# Patient Record
Sex: Female | Born: 1975
Health system: Southern US, Community
[De-identification: ages and names within clinical notes are randomized; demographics above are authoritative.]

## PROBLEM LIST (undated history)

## (undated) DIAGNOSIS — E119 Type 2 diabetes mellitus without complications: Secondary | ICD-10-CM

## (undated) HISTORY — DX: Type 2 diabetes mellitus without complications: E11.9

---

## 2005-11-29 ENCOUNTER — Ambulatory Visit: Payer: Self-pay | Admitting: Family Medicine

## 2006-05-09 ENCOUNTER — Emergency Department (HOSPITAL_COMMUNITY): Admission: EM | Admit: 2006-05-09 | Discharge: 2006-05-09 | Payer: Self-pay | Admitting: Emergency Medicine

## 2008-11-19 ENCOUNTER — Encounter (INDEPENDENT_AMBULATORY_CARE_PROVIDER_SITE_OTHER): Payer: Self-pay | Admitting: Unknown Physician Specialty

## 2008-11-19 ENCOUNTER — Other Ambulatory Visit: Admission: RE | Admit: 2008-11-19 | Discharge: 2008-11-19 | Payer: Self-pay | Admitting: Unknown Physician Specialty

## 2009-06-17 ENCOUNTER — Other Ambulatory Visit: Admission: RE | Admit: 2009-06-17 | Discharge: 2009-06-17 | Payer: Self-pay | Admitting: Unknown Physician Specialty

## 2009-10-14 ENCOUNTER — Other Ambulatory Visit: Admission: RE | Admit: 2009-10-14 | Discharge: 2009-10-14 | Payer: Self-pay | Admitting: Unknown Physician Specialty

## 2014-12-07 ENCOUNTER — Emergency Department (HOSPITAL_COMMUNITY): Payer: Self-pay

## 2014-12-07 ENCOUNTER — Emergency Department (HOSPITAL_COMMUNITY)
Admission: EM | Admit: 2014-12-07 | Discharge: 2014-12-07 | Disposition: A | Discharge status: Home / Self Care | Payer: Self-pay | Attending: Emergency Medicine | Admitting: Emergency Medicine

## 2014-12-07 ENCOUNTER — Encounter (HOSPITAL_COMMUNITY): Payer: Self-pay | Admitting: *Deleted

## 2014-12-07 DIAGNOSIS — Y9289 Other specified places as the place of occurrence of the external cause: Secondary | ICD-10-CM | POA: Insufficient documentation

## 2014-12-07 DIAGNOSIS — S92355A Nondisplaced fracture of fifth metatarsal bone, left foot, initial encounter for closed fracture: Secondary | ICD-10-CM | POA: Insufficient documentation

## 2014-12-07 DIAGNOSIS — Y9389 Activity, other specified: Secondary | ICD-10-CM | POA: Insufficient documentation

## 2014-12-07 DIAGNOSIS — Z791 Long term (current) use of non-steroidal anti-inflammatories (NSAID): Secondary | ICD-10-CM | POA: Insufficient documentation

## 2014-12-07 DIAGNOSIS — W010XXA Fall on same level from slipping, tripping and stumbling without subsequent striking against object, initial encounter: Secondary | ICD-10-CM | POA: Insufficient documentation

## 2014-12-07 DIAGNOSIS — Z720 Tobacco use: Secondary | ICD-10-CM | POA: Insufficient documentation

## 2014-12-07 DIAGNOSIS — Y998 Other external cause status: Secondary | ICD-10-CM | POA: Insufficient documentation

## 2014-12-07 MED ORDER — HYDROCODONE-ACETAMINOPHEN 5-325 MG PO TABS
1.0000 | ORAL_TABLET | Freq: Once | ORAL | Status: AC
  Administered 2014-12-07: 1 via ORAL
  Filled 2014-12-07: qty 1

## 2014-12-07 MED ORDER — HYDROCODONE-ACETAMINOPHEN 5-325 MG PO TABS: 1.0000 | ORAL_TABLET | ORAL | Status: DC | PRN

## 2014-12-07 MED ORDER — NAPROXEN 500 MG PO TABS: 500.0000 mg | ORAL_TABLET | Freq: Two times a day (BID) | ORAL | Status: DC

## 2014-12-07 NOTE — ED Notes (Signed)
Ice pack given to patient.

## 2014-12-07 NOTE — ED Provider Notes (Signed)
CSN: [id number]     Arrival date & time 12/07/14  0000 History   First MD Initiated Contact with Patient 12/07/14 0043     Chief Complaint  Patient presents with  . Foot Pain     (Consider location/radiation/quality/duration/timing/severity/associated sxs/prior Treatment) Patient is a 39 y.o. female presenting with lower extremity pain. The history is provided by the patient.  Foot Pain This is a new problem. The current episode started today. The problem occurs constantly. The problem has been unchanged. The symptoms are aggravated by walking and standing. She has tried nothing for the symptoms.   Casey Pyleamela Norman is a 39 y.o. female who presents to the ED with left foot pain. She states that her daughter tripped her and she landed on her left foot. She denies any other injuries. She has pain, swelling and bruising to the lateral aspect of the foot that radiates across the foot. The pain is severe.   History reviewed. No pertinent past medical history. History reviewed. No pertinent past surgical history. History reviewed. No pertinent family history. History  Substance Use Topics  . Smoking status: Current Every Day Smoker -- 1.00 packs/day  . Smokeless tobacco: Not on file  . Alcohol Use: Yes     Comment: 4-5 beers /day   OB History    No data available     Review of Systems Negative except as stated in HPI   Allergies  Review of patient's allergies indicates no known allergies.  Home Medications   Prior to Admission medications   Medication Sig Start Date End Date Taking? Authorizing Provider  HYDROcodone-acetaminophen (NORCO/VICODIN) 5-325 MG per tablet Take 1 tablet by mouth every 4 (four) hours as needed. 12/07/14   Fordyce Lepak Orlene OchM Trevis Eden, NP  naproxen (NAPROSYN) 500 MG tablet Take 1 tablet (500 mg total) by mouth 2 (two) times daily. 12/07/14   Adalid Beckmann Orlene OchM Jasmon Mattice, NP   BP 134/86 mmHg  Pulse 96  Temp(Src) 98 F (36.7 C) (Oral)  Resp 20  Ht 5\' 7"  (1.702 m)  Wt 245 lb (111.131 kg)   BMI 38.36 kg/m2  SpO2 99%  LMP 12/04/2014 Physical Exam  Constitutional: She is oriented to person, place, and time. She appears well-developed and well-nourished.  HENT:  Head: Normocephalic and atraumatic.  Eyes: EOM are normal.  Neck: Neck supple.  Cardiovascular: Normal rate.   Pulmonary/Chest: Effort normal.  Musculoskeletal: Normal range of motion.       Left foot: There is tenderness, bony tenderness and swelling. There is normal range of motion, normal capillary refill, no deformity and no laceration.       Feet:  Left lateral foot with tenderness on palpation, ecchymosis noted at base of 5th toe. Pedal pulse 2+, adequate circulation, good touch sensation, good strength.   Neurological: She is alert and oriented to person, place, and time. No cranial nerve deficit.  Skin: Skin is warm and dry.  Psychiatric: She has a normal mood and affect. Her behavior is normal.  Nursing note and vitals reviewed.   ED Course  Procedures  Dg Foot Complete Left  12/07/2014   CLINICAL DATA:  Injured foot tonight.  EXAM: LEFT FOOT - COMPLETE 3+ VIEW  COMPARISON:  None.  FINDINGS: There is an oblique and mildly displaced fracture involving the distal shaft of the fifth metatarsal extending to the metatarsal neck. The joint spaces are maintained. No other fractures. Mild pes cavus deformity. Calcaneal spurs are noted.  IMPRESSION: Mildly displaced oblique fracture involving the distal shaft of the  fifth metatarsal.   Electronically Signed   By: Loralie Champagne M.D.   On: 12/07/2014 01:46    MDM  39 y.o. female with pain, swelling and ecchymosis to the left foot s/p injury. Placed in splint, ice, elevation and follow up with ortho. Discussed with the patient clinical and x-ray findings and plan of care. She voices understanding and agrees with plan. She will return if any problems arise. Stable for d/c and remains neurovascularly intact.  Final diagnoses:  Closed nondisplaced fracture of fifth left  metatarsal bone, initial encounter       [facility], NP 12/07/14 2107  Dione Booze, MD 12/07/14 2300

## 2014-12-07 NOTE — ED Notes (Signed)
Pt reports injuring left foot when her daughter tripped her and then landed on her foot.

## 2014-12-07 NOTE — Discharge Instructions (Signed)
Call Dr. Mort SawyersHarrison's office for a follow up appointment. Do not take the narcotic if driving as it will make you sleepy. Return as needed for problems.

## 2014-12-10 ENCOUNTER — Encounter: Payer: Self-pay | Admitting: Orthopedic Surgery

## 2014-12-10 ENCOUNTER — Ambulatory Visit (INDEPENDENT_AMBULATORY_CARE_PROVIDER_SITE_OTHER): Payer: Self-pay | Admitting: Orthopedic Surgery

## 2014-12-10 VITALS — BP 148/78 | Ht 67.0 in | Wt 245.0 lb

## 2014-12-10 DIAGNOSIS — S92309A Fracture of unspecified metatarsal bone(s), unspecified foot, initial encounter for closed fracture: Secondary | ICD-10-CM | POA: Insufficient documentation

## 2014-12-10 DIAGNOSIS — S92302A Fracture of unspecified metatarsal bone(s), left foot, initial encounter for closed fracture: Secondary | ICD-10-CM

## 2014-12-10 MED ORDER — DIPHENHYDRAMINE HCL 25 MG PO CAPS: 25.0000 mg | ORAL_CAPSULE | ORAL | Status: DC | PRN

## 2014-12-10 MED ORDER — NAPROXEN 500 MG PO TABS: 500.0000 mg | ORAL_TABLET | Freq: Two times a day (BID) | ORAL | Status: DC

## 2014-12-10 MED ORDER — HYDROCODONE-ACETAMINOPHEN 5-325 MG PO TABS: 1.0000 | ORAL_TABLET | ORAL | Status: DC | PRN

## 2014-12-10 NOTE — Progress Notes (Signed)
Patient ID: Casey Norman, female   DOB: 02/01/1976, 39 y.o.   MRN: [id number]  Chief Complaint  Patient presents with  . Follow-up    er follow up, Left foot fx, DOI 12/07/14    HPI Casey Pyleamela Bartnik is a 39 y.o. female.  Female was injured when she tripped while being in some activities with her daughter. She went to the ER on 12/07/2014 x-rays there showed distal fifth metatarsal fracture with some separation of the fracture fragments but normal alignment. She presents complaining of pain swelling over the left fifth metatarsal at its distal aspect moderate constant HPI  Review of Systems Review of Systems Fever none Numbness tingling none  History reviewed. No pertinent past medical history.  History reviewed. No pertinent past surgical history.  History reviewed. No pertinent family history.  Social History History  Substance Use Topics  . Smoking status: Current Every Day Smoker -- 1.00 packs/day  . Smokeless tobacco: Not on file  . Alcohol Use: Yes     Comment: 4-5 beers /day    Allergies no known allergies  Current Outpatient Prescriptions  Medication Sig Dispense Refill  . HYDROcodone-acetaminophen (NORCO/VICODIN) 5-325 MG per tablet Take 1 tablet by mouth every 4 (four) hours as needed. 42 tablet 0  . diphenhydrAMINE (BENADRYL) 25 mg capsule Take 1 capsule (25 mg total) by mouth every 4 (four) hours as needed. 42 capsule 0   No current facility-administered medications for this visit.       Physical Exam Blood pressure 148/78, height 5\' 7"  (1.702 m), weight 245 lb (111.131 kg), last menstrual period 12/04/2014. Physical Exam Gen. appearance normal grooming Orientation normal mood affect normal gait crutches nonweightbearing Tender non-deformity over the left fifth metatarsal swelling and ecchymosis of the skin muscle tone normal stability tests ankle normal pulses normal sensation normal she does have some nail deformity of the great toe consistent with  onychomycosis  Data Reviewed Hospital imaging nondisplaced fracture left fifth metatarsal distal  Assessment    Encounter Diagnosis  Name Primary?  . Metatarsal fracture, left, closed, initial encounter Yes        Plan    Posterior splint secondary to swelling come back in a week for cast out of work 6 weeks continue Norco and Benadryl added

## 2014-12-10 NOTE — Patient Instructions (Signed)
No weight on foot  Continue ice and elevation

## 2014-12-17 ENCOUNTER — Ambulatory Visit (INDEPENDENT_AMBULATORY_CARE_PROVIDER_SITE_OTHER): Payer: Self-pay

## 2014-12-17 ENCOUNTER — Ambulatory Visit (INDEPENDENT_AMBULATORY_CARE_PROVIDER_SITE_OTHER): Payer: Self-pay | Admitting: Orthopedic Surgery

## 2014-12-17 VITALS — Ht 67.0 in | Wt 245.0 lb

## 2014-12-17 DIAGNOSIS — S92902D Unspecified fracture of left foot, subsequent encounter for fracture with routine healing: Secondary | ICD-10-CM

## 2014-12-17 NOTE — Progress Notes (Signed)
Chief Complaint  Patient presents with  . Follow-up    1 week recheck + xray left foot fx, DOI 12/07/14    X-rays show no change in position of the fracture the patient be placed in a short leg cast nonweightbearing come back 5 weeks for x-rays out of plaster

## 2015-01-23 ENCOUNTER — Encounter: Payer: Self-pay | Admitting: Orthopedic Surgery

## 2015-01-23 ENCOUNTER — Ambulatory Visit (INDEPENDENT_AMBULATORY_CARE_PROVIDER_SITE_OTHER): Payer: Self-pay | Admitting: Orthopedic Surgery

## 2015-01-23 ENCOUNTER — Ambulatory Visit (INDEPENDENT_AMBULATORY_CARE_PROVIDER_SITE_OTHER): Payer: 59

## 2015-01-23 ENCOUNTER — Telehealth: Payer: Self-pay | Admitting: Orthopedic Surgery

## 2015-01-23 VITALS — BP 153/96 | Ht 67.0 in | Wt 245.0 lb

## 2015-01-23 DIAGNOSIS — S92902D Unspecified fracture of left foot, subsequent encounter for fracture with routine healing: Secondary | ICD-10-CM | POA: Diagnosis not present

## 2015-01-23 MED ORDER — HYDROCODONE-ACETAMINOPHEN 5-325 MG PO TABS: 1.0000 | ORAL_TABLET | ORAL | Status: DC | PRN

## 2015-01-23 NOTE — Telephone Encounter (Signed)
Ok since boot is on left?

## 2015-01-23 NOTE — Telephone Encounter (Signed)
Patient aware.

## 2015-01-23 NOTE — Progress Notes (Signed)
Encounter Diagnosis  Name Primary?  Marland Kitchen. Foot fracture, left, with routine healing, subsequent encounter Yes    Chief Complaint  Patient presents with  . Follow-up    5 week follow up + xray oop Left foot fx, DOI 12/07/14    Left foot metatarsal fracture today's fracture shows no fracture callus her tenderness has improved I placed her in a Cam Walker short, weight-bear as tolerated with crutches wean from crutches weightbearing in the Cam Walker  Follow-up x-rays 4 weeks  Refill pain medicine  Meds ordered this encounter  Medications  . DISCONTD: HYDROcodone-acetaminophen (NORCO/VICODIN) 5-325 MG per tablet    Sig: Take 1 tablet by mouth every 4 (four) hours as needed.    Dispense:  84 tablet    Refill:  0  . HYDROcodone-acetaminophen (NORCO/VICODIN) 5-325 MG per tablet    Sig: Take 1 tablet by mouth every 4 (four) hours as needed.    Dispense:  84 tablet    Refill:  0

## 2015-01-23 NOTE — Telephone Encounter (Signed)
yes

## 2015-01-23 NOTE — Telephone Encounter (Signed)
Casey Norman is asking if she is able to drive? Please advise [phone]

## 2015-01-28 ENCOUNTER — Telehealth: Payer: Self-pay | Admitting: Family Medicine

## 2015-01-29 NOTE — Telephone Encounter (Signed)
Pt given new pt appt with Dr.Stacks 4/27 at 10:25. Pt aware to arrive 30 minutes prior with a copy of her insurance card and list of all current medications.

## 2015-02-25 ENCOUNTER — Ambulatory Visit (INDEPENDENT_AMBULATORY_CARE_PROVIDER_SITE_OTHER): Payer: 59

## 2015-02-25 ENCOUNTER — Ambulatory Visit (INDEPENDENT_AMBULATORY_CARE_PROVIDER_SITE_OTHER): Payer: Self-pay | Admitting: Orthopedic Surgery

## 2015-02-25 ENCOUNTER — Encounter: Payer: Self-pay | Admitting: Orthopedic Surgery

## 2015-02-25 VITALS — BP 139/81 | Ht 67.0 in | Wt 245.0 lb

## 2015-02-25 DIAGNOSIS — S92902D Unspecified fracture of left foot, subsequent encounter for fracture with routine healing: Secondary | ICD-10-CM

## 2015-02-25 NOTE — Progress Notes (Signed)
Chief Complaint  Patient presents with  . Follow-up    4 week follow up + xray left foot fx, DOI 12/07/14    BP 139/81 mmHg  Ht 5\' 7"  (1.702 m)  Wt 245 lb (111.131 kg)  BMI 38.36 kg/m2  Encounter Diagnosis  Name Primary?  Marland Kitchen. Foot fracture, left, with routine healing, subsequent encounter Yes    Patient comes in for evaluation of a foot fracture fifth metatarsal now in her eighth and ninth week still doesn't show any bridging callus her tenderness has decreased but is not been completely alleviated  Recommend continue boot until full 12 weeks which is April 30 and then follow-up with me about a month from now for repeat x-ray

## 2015-02-26 ENCOUNTER — Ambulatory Visit (INDEPENDENT_AMBULATORY_CARE_PROVIDER_SITE_OTHER): Payer: 59 | Admitting: Family Medicine

## 2015-02-26 ENCOUNTER — Encounter: Payer: Self-pay | Admitting: Family Medicine

## 2015-02-26 VITALS — BP 133/86 | HR 90 | Temp 98.3°F | Ht 67.0 in | Wt 238.6 lb

## 2015-02-26 DIAGNOSIS — K3184 Gastroparesis: Secondary | ICD-10-CM

## 2015-02-26 DIAGNOSIS — R5383 Other fatigue: Secondary | ICD-10-CM | POA: Diagnosis not present

## 2015-02-26 DIAGNOSIS — E1143 Type 2 diabetes mellitus with diabetic autonomic (poly)neuropathy: Secondary | ICD-10-CM | POA: Diagnosis not present

## 2015-02-26 DIAGNOSIS — R7309 Other abnormal glucose: Secondary | ICD-10-CM | POA: Diagnosis not present

## 2015-02-26 DIAGNOSIS — IMO0002 Reserved for concepts with insufficient information to code with codable children: Secondary | ICD-10-CM

## 2015-02-26 DIAGNOSIS — E1165 Type 2 diabetes mellitus with hyperglycemia: Secondary | ICD-10-CM | POA: Diagnosis not present

## 2015-02-26 DIAGNOSIS — R1010 Upper abdominal pain, unspecified: Secondary | ICD-10-CM

## 2015-02-26 LAB — POCT CBC
Granulocyte percent: 52.9 %G (ref 37–80)
HCT, POC: 46.3 % (ref 37.7–47.9)
Hemoglobin: 15.1 g/dL (ref 12.2–16.2)
Lymph, poc: 2.3 (ref 0.6–3.4)
MCH, POC: 33.1 pg — AB (ref 27–31.2)
MCHC: 32.7 g/dL (ref 31.8–35.4)
MCV: 101.4 fL — AB (ref 80–97)
MPV: 8 fL (ref 0–99.8)
POC Granulocyte: 3.1 (ref 2–6.9)
POC LYMPH PERCENT: 39.7 %L (ref 10–50)
Platelet Count, POC: 180 10*3/uL (ref 142–424)
RBC: 4.57 M/uL (ref 4.04–5.48)
RDW, POC: 12.8 %
WBC: 5.8 10*3/uL (ref 4.6–10.2)

## 2015-02-26 LAB — POCT GLYCOSYLATED HEMOGLOBIN (HGB A1C): Hemoglobin A1C: 7.9

## 2015-02-26 MED ORDER — METFORMIN HCL ER 750 MG PO TB24: 750.0000 mg | ORAL_TABLET | Freq: Every day | ORAL | Status: DC

## 2015-02-26 MED ORDER — ESOMEPRAZOLE MAGNESIUM 40 MG PO CPDR: 40.0000 mg | DELAYED_RELEASE_CAPSULE | Freq: Every day | ORAL | Status: DC

## 2015-02-26 NOTE — Progress Notes (Signed)
Subjective:  Patient ID: Casey Norman, female    DOB: Jan 18, 1976  Age: 39 y.o. MRN: [id number]  CC: Establish Care   HPI Dezeray Puccio presents for concern for diabetes. She was diagnosed with this several years ago and found had a hemoglobin A1c of 12 during pregnancy. There was speculation that the pregnancy was not the source of the diabetes but after pregnancy she did not follow-up and has been asymptomatic with regard to nausea, polyuria and polydipsia. She does not have excessive headaches or hunger. She does have some epigastric tenderness at times. There is left upper quadrant discomfort associated. This discomfort is frequent it is moderate in intensity. It is not associated with any particular activity. However, it seems that the acidic foods affected more. She gives the example of having had some tomato recently. Subsequently there was significant discomfort in the abdomen.  History Niaja has no past medical history on file.   She has no past surgical history on file.   Her family history includes Cancer in her father and paternal grandfather. There is no history of Stroke.She reports that she has been smoking.  She does not have any smokeless tobacco history on file. She reports that she drinks alcohol. She reports that she does not use illicit drugs.  Current Outpatient Prescriptions on File Prior to Visit  Medication Sig Dispense Refill  . HYDROcodone-acetaminophen (NORCO/VICODIN) 5-325 MG per tablet Take 1 tablet by mouth every 4 (four) hours as needed. 84 tablet 0   No current facility-administered medications on file prior to visit.    ROS Review of Systems  Constitutional: Negative for fever, chills, diaphoresis, appetite change, fatigue and unexpected weight change.  HENT: Negative for congestion, ear pain, hearing loss, postnasal drip, rhinorrhea, sneezing, sore throat and trouble swallowing.   Eyes: Negative for pain.  Respiratory: Negative for cough, chest  tightness and shortness of breath.   Cardiovascular: Negative for chest pain and palpitations.  Gastrointestinal: Negative for nausea, vomiting, abdominal pain, diarrhea and constipation.  Genitourinary: Negative for dysuria, frequency and menstrual problem.  Musculoskeletal: Negative for joint swelling and arthralgias.  Skin: Negative for rash.  Neurological: Negative for dizziness, weakness, numbness and headaches.  Psychiatric/Behavioral: Negative for dysphoric mood and agitation.    Objective:  BP 133/86 mmHg  Pulse 90  Temp(Src) 98.3 F (36.8 C) (Oral)  Ht 5' 7"  (1.702 m)  Wt 238 lb 9.6 oz (108.228 kg)  BMI 37.36 kg/m2  LMP 02/05/2015 (Approximate)  Physical Exam  Constitutional: She is oriented to person, place, and time. She appears well-developed and well-nourished. No distress.  HENT:  Head: Normocephalic and atraumatic.  Right Ear: External ear normal.  Left Ear: External ear normal.  Nose: Nose normal.  Mouth/Throat: Oropharynx is clear and moist.  Eyes: Conjunctivae and EOM are normal. Pupils are equal, round, and reactive to light.  Neck: Normal range of motion. Neck supple. No thyromegaly present.  Cardiovascular: Normal rate, regular rhythm and normal heart sounds.   No murmur heard. Pulmonary/Chest: Effort normal and breath sounds normal. No respiratory distress. She has no wheezes. She has no rales.  Abdominal: Soft. Bowel sounds are normal. She exhibits no distension. There is tenderness (left upper quadrant slightly to the left of the epigastric region).  Lymphadenopathy:    She has no cervical adenopathy.  Neurological: She is alert and oriented to person, place, and time. She has normal reflexes.  Skin: Skin is warm and dry.  Psychiatric: She has a normal mood and affect. Her  behavior is normal. Judgment and thought content normal.    Assessment & Plan:   Sherrye was seen today for establish care.  Diagnoses and all orders for this visit:  Elevated  random blood glucose level Orders: -     POCT glycosylated hemoglobin (Hb A1C) -     CMP14+EGFR; Standing -     Lipid panel; Standing -     CMP14+EGFR -     Lipid panel  Other fatigue Orders: -     POCT CBC -     CMP14+EGFR; Standing -     Thyroid Panel With TSH; Standing -     Vit D  25 hydroxy (rtn osteoporosis monitoring); Standing -     CMP14+EGFR -     Thyroid Panel With TSH -     Vit D  25 hydroxy (rtn osteoporosis monitoring)  Pain of upper abdomen Orders: -     US Abdomen Complete; Future  Type 2 diabetes, uncontrolled, with gastroparesis  Other orders -     metFORMIN (GLUCOPHAGE-XR) 750 MG 24 hr tablet; Take 1 tablet (750 mg total) by mouth daily with breakfast. -     esomeprazole (NEXIUM) 40 MG capsule; Take 1 capsule (40 mg total) by mouth daily.   I have discontinued Ms. Selvy's diphenhydrAMINE. I am also having her start on metFORMIN and esomeprazole. Additionally, I am having her maintain her HYDROcodone-acetaminophen.  Meds ordered this encounter  Medications  . metFORMIN (GLUCOPHAGE-XR) 750 MG 24 hr tablet    Sig: Take 1 tablet (750 mg total) by mouth daily with breakfast.    Dispense:  30 tablet    Refill:  5  . esomeprazole (NEXIUM) 40 MG capsule    Sig: Take 1 capsule (40 mg total) by mouth daily.    Dispense:  30 capsule    Refill:  3     Follow-up: Return in about 6 weeks (around 04/09/2015).  [name], M.D.

## 2015-02-26 NOTE — Patient Instructions (Signed)
Diabetes and Exercise Exercising regularly is important. It is not just about losing weight. It has many health benefits, such as:  Improving your overall fitness, flexibility, and endurance.  Increasing your bone density.  Helping with weight control.  Decreasing your body fat.  Increasing your muscle strength.  Reducing stress and tension.  Improving your overall health. People with diabetes who exercise gain additional benefits because exercise:  Reduces appetite.  Improves the body's use of blood sugar (glucose).  Helps lower or control blood glucose.  Decreases blood pressure.  Helps control blood lipids (such as cholesterol and triglycerides).  Improves the body's use of the hormone insulin by:  Increasing the body's insulin sensitivity.  Reducing the body's insulin needs.  Decreases the risk for heart disease because exercising:  Lowers cholesterol and triglycerides levels.  Increases the levels of good cholesterol (such as high-density lipoproteins [HDL]) in the body.  Lowers blood glucose levels. YOUR ACTIVITY PLAN  Choose an activity that you enjoy and set realistic goals. Your health care provider or diabetes educator can help you make an activity plan that works for you. Exercise regularly as directed by your health care provider. This includes:  Performing resistance training twice a week such as push-ups, sit-ups, lifting weights, or using resistance bands.  Performing 150 minutes of cardio exercises each week such as walking, running, or playing sports.  Staying active and spending no more than 90 minutes at one time being inactive. Even short bursts of exercise are good for you. Three 10-minute sessions spread throughout the day are just as beneficial as a single 30-minute session. Some exercise ideas include:  Taking the dog for a walk.  Taking the stairs instead of the elevator.  Dancing to your favorite song.  Doing an exercise  video.  Doing your favorite exercise with a friend. RECOMMENDATIONS FOR EXERCISING WITH TYPE 1 OR TYPE 2 DIABETES   Check your blood glucose before exercising. If blood glucose levels are greater than 240 mg/dL, check for urine ketones. Do not exercise if ketones are present.  Avoid injecting insulin into areas of the body that are going to be exercised. For example, avoid injecting insulin into:  The arms when playing tennis.  The legs when jogging.  Keep a record of:  Food intake before and after you exercise.  Expected peak times of insulin action.  Blood glucose levels before and after you exercise.  The type and amount of exercise you have done.  Review your records with your health care provider. Your health care provider will help you to develop guidelines for adjusting food intake and insulin amounts before and after exercising.  If you take insulin or oral hypoglycemic agents, watch for signs and symptoms of hypoglycemia. They include:  Dizziness.  Shaking.  Sweating.  Chills.  Confusion.  Drink plenty of water while you exercise to prevent dehydration or heat stroke. Body water is lost during exercise and must be replaced.  Talk to your health care provider before starting an exercise program to make sure it is safe for you. Remember, almost any type of activity is better than none. Document Released: 01/08/2004 Document Revised: 03/04/2014 Document Reviewed: 03/27/2013 [facility] Patient Information 2015 [place], [place]. This information is not intended to replace advice given to you by your health care provider. Make sure you discuss any questions you have with your health care provider. Hypoglycemia Hypoglycemia occurs when the glucose in your blood is too low. Glucose is a type of sugar that is your  body's main energy source. Hormones, such as insulin and glucagon, control the level of glucose in the blood. Insulin lowers blood glucose and glucagon increases  blood glucose. Having too much insulin in your blood stream, or not eating enough food containing sugar, can result in hypoglycemia. Hypoglycemia can happen to people with or without diabetes. It can develop quickly and can be a medical emergency.  CAUSES   Missing or delaying meals.  Not eating enough carbohydrates at meals.  Taking too much diabetes medicine.  Not timing your oral diabetes medicine or insulin doses with meals, snacks, and exercise.  Nausea and vomiting.  Certain medicines.  Severe illnesses, such as hepatitis, kidney disorders, and certain eating disorders.  Increased activity or exercise without eating something extra or adjusting medicines.  Drinking too much alcohol.  A nerve disorder that affects body functions like your heart rate, blood pressure, and digestion (autonomic neuropathy).  A condition where the stomach muscles do not function properly (gastroparesis). Therefore, medicines and food may not absorb properly.  Rarely, a tumor of the pancreas can produce too much insulin. SYMPTOMS   Hunger.  Sweating (diaphoresis).  Change in body temperature.  Shakiness.  Headache.  Anxiety.  Lightheadedness.  Irritability.  Difficulty concentrating.  Dry mouth.  Tingling or numbness in the hands or feet.  Restless sleep or sleep disturbances.  Altered speech and coordination.  Change in mental status.  Seizures or prolonged convulsions.  Combativeness.  Drowsiness (lethargic).  Weakness.  Increased heart rate or palpitations.  Confusion.  Pale, gray skin color.  Blurred or double vision.  Fainting. DIAGNOSIS  A physical exam and medical history will be performed. Your caregiver may make a diagnosis based on your symptoms. Blood tests and other lab tests may be performed to confirm a diagnosis. Once the diagnosis is made, your caregiver will see if your signs and symptoms go away once your blood glucose is raised.  TREATMENT   Usually, you can easily treat your hypoglycemia when you notice symptoms.  Check your blood glucose. If it is less than 70 mg/dl, take one of the following:   3-4 glucose tablets.    cup juice.    cup regular soda.   1 cup skim milk.   -1 tube of glucose gel.   5-6 hard candies.   Avoid high-fat drinks or food that may delay a rise in blood glucose levels.  Do not take more than the recommended amount of sugary foods, drinks, gel, or tablets. Doing so will cause your blood glucose to go too high.   Wait 10-15 minutes and recheck your blood glucose. If it is still less than 70 mg/dl or below your target range, repeat treatment.   Eat a snack if it is more than 1 hour until your next meal.  There may be a time when your blood glucose may go so low that you are unable to treat yourself at home when you start to notice symptoms. You may need someone to help you. You may even faint or be unable to swallow. If you cannot treat yourself, someone will need to bring you to the hospital.  [place]  If you have diabetes, follow your diabetes management plan by:  Taking your medicines as directed.  Following your exercise plan.  Following your meal plan. Do not skip meals. Eat on time.  Testing your blood glucose regularly. Check your blood glucose before and after exercise. If you exercise longer or different than usual, be sure  to check blood glucose more frequently.  Wearing your medical alert jewelry that says you have diabetes.  Identify the cause of your hypoglycemia. Then, develop ways to prevent the recurrence of hypoglycemia.  Do not take a hot bath or shower right after an insulin shot.  Always carry treatment with you. Glucose tablets are the easiest to carry.  If you are going to drink alcohol, drink it only with meals.  Tell friends or family members ways to keep you safe during a seizure. This may include removing hard or sharp objects from  the area or turning you on your side.  Maintain a healthy weight. SEEK MEDICAL CARE IF:   You are having problems keeping your blood glucose in your target range.  You are having frequent episodes of hypoglycemia.  You feel you might be having side effects from your medicines.  You are not sure why your blood glucose is dropping so low.  You notice a change in vision or a new problem with your vision. SEEK IMMEDIATE MEDICAL CARE IF:   Confusion develops.  A change in mental status occurs.  The inability to swallow develops.  Fainting occurs. Document Released: 10/18/2005 Document Revised: 10/23/2013 Document Reviewed: 02/14/2012 [facility] Patient Information 2015 [place], [place]. This information is not intended to replace advice given to you by your health care provider. Make sure you discuss any questions you have with your health care provider. Basic Carbohydrate Counting for Diabetes Mellitus Carbohydrate counting is a method for keeping track of the amount of carbohydrates you eat. Eating carbohydrates naturally increases the level of sugar (glucose) in your blood, so it is important for you to know the amount that is okay for you to have in every meal. Carbohydrate counting helps keep the level of glucose in your blood within normal limits. The amount of carbohydrates allowed is different for every person. A dietitian can help you calculate the amount that is right for you. Once you know the amount of carbohydrates you can have, you can count the carbohydrates in the foods you want to eat. Carbohydrates are found in the following foods:  Grains, such as breads and cereals.  Dried beans and soy products.  Starchy vegetables, such as potatoes, peas, and corn.  Fruit and fruit juices.  Milk and yogurt.  Sweets and snack foods, such as cake, cookies, candy, chips, soft drinks, and fruit drinks. CARBOHYDRATE COUNTING There are two ways to count the carbohydrates in your food.  You can use either of the methods or a combination of both. Reading the "Nutrition Facts" on Packaged Food The "Nutrition Facts" is an area that is included on the labels of almost all packaged food and beverages in the [place]. It includes the serving size of that food or beverage and information about the nutrients in each serving of the food, including the grams (g) of carbohydrate per serving.  Decide the number of servings of this food or beverage that you will be able to eat or drink. Multiply that number of servings by the number of grams of carbohydrate that is listed on the label for that serving. The total will be the amount of carbohydrates you will be having when you eat or drink this food or beverage. Learning Standard Serving Sizes of Food When you eat food that is not packaged or does not include "Nutrition Facts" on the label, you need to measure the servings in order to count the amount of carbohydrates.A serving of most carbohydrate-rich foods contains  about 15 g of carbohydrates. The following list includes serving sizes of carbohydrate-rich foods that provide 15 g ofcarbohydrate per serving:   1 slice of bread (1 oz) or 1 six-inch tortilla.    of a hamburger bun or English muffin.  4-6 crackers.   cup unsweetened dry cereal.    cup hot cereal.   cup rice or pasta.    cup mashed potatoes or  of a large baked potato.  1 cup fresh fruit or one small piece of fruit.    cup canned or frozen fruit or fruit juice.  1 cup milk.   cup plain fat-free yogurt or yogurt sweetened with artificial sweeteners.   cup cooked dried beans or starchy vegetable, such as peas, corn, or potatoes.  Decide the number of standard-size servings that you will eat. Multiply that number of servings by 15 (the grams of carbohydrates in that serving). For example, if you eat 2 cups of strawberries, you will have eaten 2 servings and 30 g of carbohydrates (2 servings x 15 g = 30  g). For foods such as soups and casseroles, in which more than one food is mixed in, you will need to count the carbohydrates in each food that is included. EXAMPLE OF CARBOHYDRATE COUNTING Sample Dinner  3 oz chicken breast.   cup of brown rice.   cup of corn.  1 cup milk.   1 cup strawberries with sugar-free whipped topping.  Carbohydrate Calculation Step 1: Identify the foods that contain carbohydrates:   Rice.   Corn.   Milk.   Strawberries. Step 2:Calculate the number of servings eaten of each:   2 servings of rice.   1 serving of corn.   1 serving of milk.   1 serving of strawberries. Step 3: Multiply each of those number of servings by 15 g:   2 servings of rice x 15 g = 30 g.   1 serving of corn x 15 g = 15 g.   1 serving of milk x 15 g = 15 g.   1 serving of strawberries x 15 g = 15 g. Step 4: Add together all of the amounts to find the total grams of carbohydrates eaten: 30 g + 15 g + 15 g + 15 g = 75 g. Document Released: 10/18/2005 Document Revised: 03/04/2014 Document Reviewed: 09/14/2013 [facility] Patient Information 2015 [place]. This information is not intended to replace advice given to you by your health care provider. Make sure you discuss any questions you have with your health care provider.

## 2015-02-27 ENCOUNTER — Other Ambulatory Visit: Payer: Self-pay | Admitting: Family Medicine

## 2015-02-27 ENCOUNTER — Telehealth: Payer: Self-pay

## 2015-02-27 LAB — CMP14+EGFR
ALT: 62 IU/L — ABNORMAL HIGH (ref 0–32)
AST: 75 IU/L — ABNORMAL HIGH (ref 0–40)
Albumin/Globulin Ratio: 1.4 (ref 1.1–2.5)
Albumin: 4.1 g/dL (ref 3.5–5.5)
Alkaline Phosphatase: 71 IU/L (ref 39–117)
BUN/Creatinine Ratio: 13 (ref 9–23)
BUN: 8 mg/dL (ref 6–24)
Bilirubin Total: 0.4 mg/dL (ref 0.0–1.2)
CO2: 24 mmol/L (ref 18–29)
Calcium: 9.3 mg/dL (ref 8.7–10.2)
Chloride: 97 mmol/L (ref 97–108)
Creatinine, Ser: 0.62 mg/dL (ref 0.57–1.00)
GFR calc Af Amer: 130 mL/min/{1.73_m2} (ref >59)
GFR calc non Af Amer: 113 mL/min/{1.73_m2} (ref >59)
Globulin, Total: 2.9 g/dL (ref 1.5–4.5)
Glucose: 219 mg/dL — ABNORMAL HIGH (ref 65–99)
Potassium: 4.1 mmol/L (ref 3.5–5.2)
Sodium: 135 mmol/L (ref 134–144)
Total Protein: 7 g/dL (ref 6.0–8.5)

## 2015-02-27 LAB — LIPID PANEL
Chol/HDL Ratio: 6.6 ratio units — ABNORMAL HIGH (ref 0.0–4.4)
Cholesterol, Total: 199 mg/dL (ref 100–199)
HDL: 30 mg/dL — ABNORMAL LOW (ref >39)
LDL Calculated: 111 mg/dL — ABNORMAL HIGH (ref 0–99)
Triglycerides: 291 mg/dL — ABNORMAL HIGH (ref 0–149)
VLDL Cholesterol Cal: 58 mg/dL — ABNORMAL HIGH (ref 5–40)

## 2015-02-27 LAB — THYROID PANEL WITH TSH
Free Thyroxine Index: 2.2 (ref 1.2–4.9)
T3 Uptake Ratio: 27 % (ref 24–39)
T4, Total: 8.2 ug/dL (ref 4.5–12.0)
TSH: 1.22 u[IU]/mL (ref 0.450–4.500)

## 2015-02-27 LAB — VITAMIN D 25 HYDROXY (VIT D DEFICIENCY, FRACTURES): Vit D, 25-Hydroxy: 15.9 ng/mL — ABNORMAL LOW (ref 30.0–100.0)

## 2015-02-27 MED ORDER — VITAMIN D (ERGOCALCIFEROL) 1.25 MG (50000 UNIT) PO CAPS: 50000.0000 [IU] | ORAL_CAPSULE | ORAL | Status: DC

## 2015-02-27 NOTE — Progress Notes (Signed)
lmtcb

## 2015-02-28 ENCOUNTER — Other Ambulatory Visit: Payer: Self-pay | Admitting: *Deleted

## 2015-02-28 MED ORDER — RABEPRAZOLE SODIUM 20 MG PO TBEC: 20.0000 mg | DELAYED_RELEASE_TABLET | Freq: Every day | ORAL | Status: DC

## 2015-02-28 NOTE — Progress Notes (Signed)
Insurance would not cover Nexium Change to aciphex [facility] Dr [facility]

## 2015-03-03 NOTE — Telephone Encounter (Signed)
x

## 2015-03-04 ENCOUNTER — Telehealth: Payer: Self-pay

## 2015-03-04 NOTE — Telephone Encounter (Signed)
Esomeprazole not covered by insurance  Wants patient to try omeprazole, aciphex, or Dexilant   Can you change?

## 2015-03-04 NOTE — Telephone Encounter (Signed)
I wrote for AcipHex which is rabeprazole

## 2015-03-07 ENCOUNTER — Ambulatory Visit (HOSPITAL_COMMUNITY)
Admission: RE | Admit: 2015-03-07 | Discharge: 2015-03-07 | Disposition: A | Discharge status: Home / Self Care | Payer: 59 | Source: Ambulatory Visit | Attending: Family Medicine | Admitting: Family Medicine

## 2015-03-07 DIAGNOSIS — R1010 Upper abdominal pain, unspecified: Secondary | ICD-10-CM | POA: Insufficient documentation

## 2015-03-07 DIAGNOSIS — R110 Nausea: Secondary | ICD-10-CM | POA: Insufficient documentation

## 2015-03-07 DIAGNOSIS — R932 Abnormal findings on diagnostic imaging of liver and biliary tract: Secondary | ICD-10-CM | POA: Diagnosis not present

## 2015-03-25 ENCOUNTER — Ambulatory Visit (INDEPENDENT_AMBULATORY_CARE_PROVIDER_SITE_OTHER): Payer: 59 | Admitting: Orthopedic Surgery

## 2015-03-25 ENCOUNTER — Ambulatory Visit (INDEPENDENT_AMBULATORY_CARE_PROVIDER_SITE_OTHER): Payer: 59

## 2015-03-25 ENCOUNTER — Encounter: Payer: Self-pay | Admitting: Orthopedic Surgery

## 2015-03-25 VITALS — BP 153/87 | Ht 67.0 in | Wt 238.6 lb

## 2015-03-25 DIAGNOSIS — S92902D Unspecified fracture of left foot, subsequent encounter for fracture with routine healing: Secondary | ICD-10-CM

## 2015-03-25 NOTE — Patient Instructions (Addendum)
Regular shoes  Activity as tolerated call if any problems

## 2015-03-25 NOTE — Progress Notes (Signed)
Encounter Diagnosis  Name Primary?  Marland Kitchen. Foot fracture, left, with routine healing, subsequent encounter Yes    Fracture care follow-up outside of the time of fracture care.cc  Chief Complaint  Patient presents with  . Follow-up    4 week follow up + xray left foot fx, DOI 12/07/14    She has minimal discomfort now. Her x-ray shows fibrous union distal oblique to spiral left fifth metatarsal fracture. Now having no pain weightbearing in regular shoes  She can return to normal activities and return to work on June 9  Follow-up if needed

## 2015-04-09 ENCOUNTER — Ambulatory Visit (INDEPENDENT_AMBULATORY_CARE_PROVIDER_SITE_OTHER): Payer: 59 | Admitting: Family Medicine

## 2015-04-09 ENCOUNTER — Telehealth: Payer: Self-pay | Admitting: Family Medicine

## 2015-04-09 ENCOUNTER — Encounter: Payer: Self-pay | Admitting: Family Medicine

## 2015-04-09 VITALS — BP 156/87 | HR 91 | Temp 98.8°F | Ht 67.0 in | Wt 235.2 lb

## 2015-04-09 DIAGNOSIS — E1165 Type 2 diabetes mellitus with hyperglycemia: Secondary | ICD-10-CM | POA: Diagnosis not present

## 2015-04-09 DIAGNOSIS — R1013 Epigastric pain: Secondary | ICD-10-CM | POA: Diagnosis not present

## 2015-04-09 DIAGNOSIS — R110 Nausea: Secondary | ICD-10-CM | POA: Insufficient documentation

## 2015-04-09 DIAGNOSIS — G8929 Other chronic pain: Secondary | ICD-10-CM | POA: Diagnosis not present

## 2015-04-09 DIAGNOSIS — IMO0002 Reserved for concepts with insufficient information to code with codable children: Secondary | ICD-10-CM

## 2015-04-09 MED ORDER — LISINOPRIL 10 MG PO TABS: 10.0000 mg | ORAL_TABLET | Freq: Every day | ORAL | Status: DC

## 2015-04-09 MED ORDER — SITAGLIP PHOS-METFORMIN HCL ER 100-1000 MG PO TB24: 1.0000 | ORAL_TABLET | Freq: Every day | ORAL | Status: DC

## 2015-04-09 MED ORDER — SAXAGLIPTIN-METFORMIN ER 5-1000 MG PO TB24: 1.0000 | ORAL_TABLET | Freq: Every day | ORAL | Status: DC

## 2015-04-09 NOTE — Telephone Encounter (Signed)
Tell pt. That I made a change. If this one doesn't work due to cost, I'll have to prescribe two different meds.

## 2015-04-09 NOTE — Progress Notes (Signed)
Subjective:  Patient ID: Casey Norman, female    DOB: 02/15/1976  Age: 39 y.o. MRN: [id number]  CC: Diabetes   HPI Casey Norman presents forFollow-up of diabetes. Patient does  check blood sugar at home. Log attached. Swells at epigastrum. Nausea every afternoon. Patient denies symptoms such as polyuria, polydipsia, excessive hunger, nausea No significant hypoglycemic spells noted. Medications as noted below. Taking them regularly without complication/adverse reaction being reported today. However the cost is high. She would like to try other options.  Patient also reports continued epigastric pain.it is a sharp pain. It is moderately severe in the 7/10 range. It interferes with appetite. No food intolerances however such as fatty foods or spicy foods. Minimal relief with Nexium. It is associated with ongoing nausea. This has been recurrent with multiple episodes current episode lasting now for 4 days.  History Casey Norman has no past medical history on file.   She has no past surgical history on file.   Her family history includes Cancer in her father and paternal grandfather. There is no history of Stroke.She reports that she has been smoking.  She does not have any smokeless tobacco history on file. She reports that she drinks alcohol. She reports that she does not use illicit drugs.  Current Outpatient Prescriptions on File Prior to Visit  Medication Sig Dispense Refill  . esomeprazole (NEXIUM) 40 MG capsule Take 1 capsule (40 mg total) by mouth daily. 30 capsule 3  . HYDROcodone-acetaminophen (NORCO/VICODIN) 5-325 MG per tablet Take 1 tablet by mouth every 4 (four) hours as needed. 84 tablet 0  . Vitamin D, Ergocalciferol, (DRISDOL) 50000 UNITS CAPS capsule Take 1 capsule (50,000 Units total) by mouth 2 (two) times a week. 16 capsule 0   No current facility-administered medications on file prior to visit.    ROS Review of Systems  Constitutional: Negative for fever, chills,  diaphoresis, appetite change, fatigue and unexpected weight change.  HENT: Negative for congestion, ear pain, hearing loss, postnasal drip, rhinorrhea, sneezing, sore throat and trouble swallowing.   Eyes: Negative for pain.  Respiratory: Negative for cough, chest tightness and shortness of breath.   Cardiovascular: Negative for chest pain and palpitations.  Gastrointestinal: Negative for nausea, vomiting, abdominal pain, diarrhea and constipation.  Genitourinary: Negative for dysuria, frequency and menstrual problem.  Musculoskeletal: Negative for joint swelling and arthralgias.  Skin: Negative for rash.  Neurological: Negative for dizziness, weakness, numbness and headaches.  Psychiatric/Behavioral: Negative for dysphoric mood and agitation.    Objective:  BP 156/87 mmHg  Pulse 91  Temp(Src) 98.8 F (37.1 C) (Oral)  Ht 5' 7"  (1.702 m)  Wt 235 lb 3.2 oz (106.686 kg)  BMI 36.83 kg/m2  LMP 04/04/2015  BP Readings from Last 3 Encounters:  04/09/15 156/87  03/25/15 153/87  02/26/15 133/86    Wt Readings from Last 3 Encounters:  04/09/15 235 lb 3.2 oz (106.686 kg)  03/25/15 238 lb 9.6 oz (108.228 kg)  02/26/15 238 lb 9.6 oz (108.228 kg)     Physical Exam  Constitutional: She is oriented to person, place, and time. She appears well-developed and well-nourished. No distress.  HENT:  Head: Normocephalic and atraumatic.  Right Ear: External ear normal.  Left Ear: External ear normal.  Nose: Nose normal.  Mouth/Throat: Oropharynx is clear and moist.  Eyes: Conjunctivae and EOM are normal. Pupils are equal, round, and reactive to light.  Neck: Normal range of motion. Neck supple. No thyromegaly present.  Cardiovascular: Normal rate, regular rhythm and normal heart  sounds.   No murmur heard. Pulmonary/Chest: Effort normal and breath sounds normal. No respiratory distress. She has no wheezes. She has no rales.  Abdominal: Soft. Bowel sounds are normal. She exhibits no distension.  There is no tenderness.  Lymphadenopathy:    She has no cervical adenopathy.  Neurological: She is alert and oriented to person, place, and time. She has normal reflexes.  Skin: Skin is warm and dry.  Psychiatric: She has a normal mood and affect. Her behavior is normal. Judgment and thought content normal.    Lab Results  Component Value Date   HGBA1C 7.9 02/26/2015    Lab Results  Component Value Date   WBC 5.8 02/26/2015   HGB 15.1 02/26/2015   HCT 46.3 02/26/2015   GLUCOSE 178* 04/09/2015   CHOL 199 02/26/2015   TRIG 291* 02/26/2015   HDL 30* 02/26/2015   LDLCALC 111* 02/26/2015   ALT 83* 04/09/2015   AST 90* 04/09/2015   NA 138 04/09/2015   K 4.6 04/09/2015   CL 98 04/09/2015   CREATININE 0.75 04/09/2015   BUN 9 04/09/2015   CO2 25 04/09/2015   TSH 1.220 02/26/2015   HGBA1C 7.9 02/26/2015     Assessment & Plan:   Casey Norman was seen today for diabetes.  Diagnoses and all orders for this visit:  Uncontrolled diabetes mellitus Orders: -     CT Abd Wo & W Cm; Future -     CMP14+EGFR -     Amylase -     Lipase  Nausea without vomiting Orders: -     CT Abd Wo & W Cm; Future -     CMP14+EGFR -     Amylase -     Lipase  Abdominal pain, chronic, epigastric Orders: -     CT Abd Wo & W Cm; Future -     CMP14+EGFR -     Amylase -     Lipase  Other orders -     lisinopril (PRINIVIL,ZESTRIL) 10 MG tablet; Take 1 tablet (10 mg total) by mouth daily. -     Discontinue: SitaGLIPtin-MetFORMIN HCl (JANUMET XR) [phone] MG TB24; Take 1 tablet by mouth daily. -     Discontinue: Saxagliptin-Metformin 03-999 MG TB24; Take 1 tablet by mouth daily.   I have discontinued Ms. Westervelt's metFORMIN, RABEprazole, and SitaGLIPtin-MetFORMIN HCl. I am also having her start on lisinopril. Additionally, I am having her maintain her HYDROcodone-acetaminophen, esomeprazole, and Vitamin D (Ergocalciferol).  Meds ordered this encounter  Medications  . lisinopril (PRINIVIL,ZESTRIL)  10 MG tablet    Sig: Take 1 tablet (10 mg total) by mouth daily.    Dispense:  90 tablet    Refill:  3  . DISCONTD: SitaGLIPtin-MetFORMIN HCl (JANUMET XR) [phone] MG TB24    Sig: Take 1 tablet by mouth daily.    Dispense:  30 tablet    Refill:  5  . DISCONTD: Saxagliptin-Metformin 03-999 MG TB24    Sig: Take 1 tablet by mouth daily.    Dispense:  30 tablet    Refill:  2     Follow-up: Return in about 6 weeks (around 05/21/2015).  [name], M.D.

## 2015-04-09 NOTE — Telephone Encounter (Signed)
Please advise on med change?

## 2015-04-10 ENCOUNTER — Telehealth: Payer: Self-pay

## 2015-04-10 LAB — CMP14+EGFR
ALT: 83 IU/L — ABNORMAL HIGH (ref 0–32)
AST: 90 IU/L — ABNORMAL HIGH (ref 0–40)
Albumin/Globulin Ratio: 1.4 (ref 1.1–2.5)
Albumin: 4.3 g/dL (ref 3.5–5.5)
Alkaline Phosphatase: 76 IU/L (ref 39–117)
BUN/Creatinine Ratio: 12 (ref 9–23)
BUN: 9 mg/dL (ref 6–24)
Bilirubin Total: 0.3 mg/dL (ref 0.0–1.2)
CO2: 25 mmol/L (ref 18–29)
Calcium: 9.7 mg/dL (ref 8.7–10.2)
Chloride: 98 mmol/L (ref 97–108)
Creatinine, Ser: 0.75 mg/dL (ref 0.57–1.00)
GFR calc Af Amer: 115 mL/min/{1.73_m2} (ref >59)
GFR calc non Af Amer: 100 mL/min/{1.73_m2} (ref >59)
Globulin, Total: 3 g/dL (ref 1.5–4.5)
Glucose: 178 mg/dL — ABNORMAL HIGH (ref 65–99)
Potassium: 4.6 mmol/L (ref 3.5–5.2)
Sodium: 138 mmol/L (ref 134–144)
Total Protein: 7.3 g/dL (ref 6.0–8.5)

## 2015-04-10 LAB — AMYLASE: Amylase: 47 U/L (ref 31–124)

## 2015-04-10 LAB — LIPASE: Lipase: 52 U/L (ref 0–59)

## 2015-04-10 MED ORDER — LINAGLIPTIN-METFORMIN HCL 2.5-850 MG PO TABS: 1.0000 | ORAL_TABLET | Freq: Every day | ORAL | Status: DC

## 2015-04-10 NOTE — Telephone Encounter (Signed)
Let's try the Jentadueto first. I have sent in that prescription. Have her let me know if it is affordable. Unfortunately the computer's formulary information is not useful for these medications. Thanks, WS.

## 2015-04-10 NOTE — Telephone Encounter (Signed)
I am having to prior authorize Janumet XR   One of the questions is has she ttried and failed Kazano, Jentadueto, and Kombiglyze XR   I do not see that documented anywhere.  Please advise

## 2015-04-10 NOTE — Telephone Encounter (Signed)
Pt notified of change Will call if too expensive

## 2015-05-21 ENCOUNTER — Ambulatory Visit: Payer: 59 | Admitting: Family Medicine

## 2015-05-22 ENCOUNTER — Ambulatory Visit (INDEPENDENT_AMBULATORY_CARE_PROVIDER_SITE_OTHER): Payer: 59 | Admitting: Family Medicine

## 2015-05-22 ENCOUNTER — Encounter: Payer: Self-pay | Admitting: Family Medicine

## 2015-05-22 VITALS — BP 134/92 | HR 94 | Temp 98.2°F | Ht 67.0 in | Wt 227.2 lb

## 2015-05-22 DIAGNOSIS — E119 Type 2 diabetes mellitus without complications: Secondary | ICD-10-CM | POA: Insufficient documentation

## 2015-05-22 NOTE — Progress Notes (Signed)
Subjective:  Patient ID: Casey Norman, female    DOB: [birth year]  Age: 40 y.o. MRN: [id number]  CC: Diabetes and Hypertension   HPI Casey Norman presents forFollow-up of diabetes. Patient does  check blood sugar at home. Extensive log sheets over the last 3 months have been kept and they are reviewed with the patient. It shows a significant trend downward into near normal range over the last 3 months. She relates this to medication increased exercise and active weight loss diet. Patient denies symptoms such as polyuria, polydipsia, excessive hunger, nausea No significant hypoglycemic spells noted. Medications as noted below. Taking them regularly without complication/adverse reaction being reported today.   History Casey Norman has no past medical history on file.   She has no past surgical history on file.   Her family history includes Cancer in her father and paternal grandfather. There is no history of Stroke.She reports that she has been smoking.  She does not have any smokeless tobacco history on file. She reports that she drinks alcohol. She reports that she does not use illicit drugs.  Current Outpatient Prescriptions on File Prior to Visit  Medication Sig Dispense Refill  . esomeprazole (NEXIUM) 40 MG capsule Take 1 capsule (40 mg total) by mouth daily. 30 capsule 3  . Linagliptin-Metformin HCl (JENTADUETO) 2.5-850 MG TABS Take 1 tablet by mouth daily. 60 tablet 5  . lisinopril (PRINIVIL,ZESTRIL) 10 MG tablet Take 1 tablet (10 mg total) by mouth daily. 90 tablet 3  . HYDROcodone-acetaminophen (NORCO/VICODIN) 5-325 MG per tablet Take 1 tablet by mouth every 4 (four) hours as needed. (Patient not taking: Reported on 05/22/2015) 84 tablet 0  . Vitamin D, Ergocalciferol, (DRISDOL) 50000 UNITS CAPS capsule Take 1 capsule (50,000 Units total) by mouth 2 (two) times a week. (Patient not taking: Reported on 05/22/2015) 16 capsule 0   No current facility-administered medications on file prior  to visit.    ROS Review of Systems  Constitutional: Negative for fever, chills, diaphoresis, appetite change, fatigue and unexpected weight change.  HENT: Negative for congestion, ear pain, hearing loss, postnasal drip, rhinorrhea, sneezing, sore throat and trouble swallowing.   Eyes: Negative for pain.  Respiratory: Negative for cough, chest tightness and shortness of breath.   Cardiovascular: Negative for chest pain and palpitations.  Gastrointestinal: Negative for nausea, vomiting, abdominal pain, diarrhea and constipation.  Genitourinary: Negative for dysuria, frequency and menstrual problem.  Musculoskeletal: Negative for joint swelling and arthralgias.  Skin: Negative for rash.  Neurological: Negative for dizziness, weakness, numbness and headaches.  Psychiatric/Behavioral: Negative for dysphoric mood and agitation.    Objective:  BP 134/92 mmHg  Pulse 94  Temp(Src) 98.2 F (36.8 C) (Oral)  Ht  (1.702 m)  Wt 227 lb 3.2 oz (103.057 kg)  BMI 35.58 kg/m2  LMP 05/20/2015  BP Readings from Last 3 Encounters:  05/22/15 134/92  04/09/15 156/87  03/25/15 153/87    Wt Readings from Last 3 Encounters:  05/22/15 227 lb 3.2 oz (103.057 kg)  04/09/15 235 lb 3.2 oz (106.686 kg)  03/25/15 238 lb 9.6 oz (108.228 kg)     Physical Exam  Constitutional: She is oriented to person, place, and time. She appears well-developed and well-nourished. No distress.  HENT:  Head: Normocephalic and atraumatic.  Right Ear: External ear normal.  Left Ear: External ear normal.  Nose: Nose normal.  Mouth/Throat: Oropharynx is clear and moist.  Eyes: Conjunctivae and EOM are normal. Pupils are equal, round, and reactive to light.  Neck:  Normal range of motion. Neck supple. No thyromegaly present.  Cardiovascular: Normal rate, regular rhythm and normal heart sounds.   No murmur heard. Pulmonary/Chest: Effort normal and breath sounds normal. No respiratory distress. She has no wheezes. She  has no rales.  Abdominal: Soft. Bowel sounds are normal. She exhibits no distension. There is no tenderness.  Lymphadenopathy:    She has no cervical adenopathy.  Neurological: She is alert and oriented to person, place, and time. She has normal reflexes.  Skin: Skin is warm and dry.  Psychiatric: She has a normal mood and affect. Her behavior is normal. Judgment and thought content normal.    Lab Results  Component Value Date   HGBA1C 7.9 02/26/2015    Lab Results  Component Value Date   WBC 5.8 02/26/2015   HGB 15.1 02/26/2015   HCT 46.3 02/26/2015   GLUCOSE 178* 04/09/2015   CHOL 199 02/26/2015   TRIG 291* 02/26/2015   HDL 30* 02/26/2015   LDLCALC 111* 02/26/2015   ALT 83* 04/09/2015   AST 90* 04/09/2015   NA 138 04/09/2015   K 4.6 04/09/2015   CL 98 04/09/2015   CREATININE 0.75 04/09/2015   BUN 9 04/09/2015   CO2 25 04/09/2015   TSH 1.220 02/26/2015   HGBA1C 7.9 02/26/2015     Assessment & Plan:   Casey Norman was seen today for diabetes and hypertension.  Diagnoses and all orders for this visit:  Diabetes mellitus type 2, controlled, without complications  I am having Ms. Leisinger maintain her HYDROcodone-acetaminophen, esomeprazole, Vitamin D (Ergocalciferol), lisinopril, and Linagliptin-Metformin HCl.  No orders of the defined types were placed in this encounter.     Follow-up: Return in about 3 months (around 08/22/2015).  Mechele Claude, M.D.

## 2015-05-22 NOTE — Addendum Note (Signed)
Addended by: Mechele Claude on: 05/22/2015 08:13 PM   Modules accepted: Kipp Brood

## 2015-05-29 ENCOUNTER — Other Ambulatory Visit (INDEPENDENT_AMBULATORY_CARE_PROVIDER_SITE_OTHER): Payer: 59

## 2015-05-29 DIAGNOSIS — E119 Type 2 diabetes mellitus without complications: Secondary | ICD-10-CM | POA: Diagnosis not present

## 2015-05-29 LAB — POCT GLYCOSYLATED HEMOGLOBIN (HGB A1C): Hemoglobin A1C: 6

## 2015-05-29 NOTE — Progress Notes (Signed)
Lab only 

## 2015-07-17 ENCOUNTER — Other Ambulatory Visit: Payer: Self-pay | Admitting: Family Medicine

## 2015-07-22 ENCOUNTER — Encounter: Payer: Self-pay | Admitting: Family Medicine

## 2015-07-22 ENCOUNTER — Encounter (INDEPENDENT_AMBULATORY_CARE_PROVIDER_SITE_OTHER): Payer: Self-pay

## 2015-07-22 ENCOUNTER — Ambulatory Visit (INDEPENDENT_AMBULATORY_CARE_PROVIDER_SITE_OTHER): Payer: 59 | Admitting: Family Medicine

## 2015-07-22 VITALS — BP 140/85 | HR 91 | Temp 99.1°F | Ht 67.0 in | Wt 225.2 lb

## 2015-07-22 DIAGNOSIS — J201 Acute bronchitis due to Hemophilus influenzae: Secondary | ICD-10-CM | POA: Diagnosis not present

## 2015-07-22 MED ORDER — LEVOFLOXACIN 500 MG PO TABS: 500.0000 mg | ORAL_TABLET | Freq: Every day | ORAL | Status: DC

## 2015-07-22 MED ORDER — HYDROCOD POLST-CPM POLST ER 10-8 MG/5ML PO SUER: 5.0000 mL | Freq: Two times a day (BID) | ORAL | Status: DC | PRN

## 2015-07-22 NOTE — Progress Notes (Signed)
Subjective:  Patient ID: Casey Norman, female    DOB: 29-Jun-1976  Age: 39 y.o. MRN: [id number]  CC: URI   HPI Lashia Niese presents for frequent cough. Onset 10 days ago. Became productive 2 days ago. Sputum is described as thick and yellow. She has no shortness of breath and no wheezing. No fever chills or sweats. Some mild upper respiratory congestion.  History Amauri has no past medical history on file.   She has no past surgical history on file.   Her family history includes Cancer in her father and paternal grandfather. There is no history of Stroke.She reports that she has been smoking.  She does not have any smokeless tobacco history on file. She reports that she drinks alcohol. She reports that she does not use illicit drugs.  Outpatient Prescriptions Prior to Visit  Medication Sig Dispense Refill  . esomeprazole (NEXIUM) 40 MG capsule Take 1 capsule (40 mg total) by mouth daily. 30 capsule 3  . KOMBIGLYZE XR 03-999 MG TB24 TAKE ONE TABLET BY MOUTH ONCE DAILY 30 tablet 0  . lisinopril (PRINIVIL,ZESTRIL) 10 MG tablet Take 1 tablet (10 mg total) by mouth daily. 90 tablet 3  . HYDROcodone-acetaminophen (NORCO/VICODIN) 5-325 MG per tablet Take 1 tablet by mouth every 4 (four) hours as needed. (Patient not taking: Reported on 07/22/2015) 84 tablet 0  . Linagliptin-Metformin HCl (JENTADUETO) 2.5-850 MG TABS Take 1 tablet by mouth daily. (Patient not taking: Reported on 07/22/2015) 60 tablet 5  . Vitamin D, Ergocalciferol, (DRISDOL) 50000 UNITS CAPS capsule Take 1 capsule (50,000 Units total) by mouth 2 (two) times a week. (Patient not taking: Reported on 05/22/2015) 16 capsule 0   No facility-administered medications prior to visit.    ROS Review of Systems  Constitutional: Negative for fever, chills, activity change and appetite change.  HENT: Positive for congestion. Negative for ear discharge, ear pain, hearing loss, nosebleeds, postnasal drip, rhinorrhea, sinus pressure,  sneezing and trouble swallowing.   Respiratory: Positive for cough. Negative for chest tightness and shortness of breath.   Cardiovascular: Negative for chest pain and palpitations.  Skin: Negative for rash.    Objective:  BP 140/85 mmHg  Pulse 91  Temp(Src) 99.1 F (37.3 C) (Oral)  Ht  (1.702 m)  Wt 225 lb 3.2 oz (102.15 kg)  BMI 35.26 kg/m2  LMP 06/21/2015  BP Readings from Last 3 Encounters:  07/22/15 140/85  05/22/15 134/92  04/09/15 156/87    Wt Readings from Last 3 Encounters:  07/22/15 225 lb 3.2 oz (102.15 kg)  05/22/15 227 lb 3.2 oz (103.057 kg)  04/09/15 235 lb 3.2 oz (106.686 kg)     Physical Exam  Constitutional: She appears well-developed and well-nourished.  HENT:  Head: Normocephalic and atraumatic.  Right Ear: Tympanic membrane and external ear normal. No decreased hearing is noted.  Left Ear: Tympanic membrane and external ear normal. No decreased hearing is noted.  Nose: Mucosal edema present. Right sinus exhibits no frontal sinus tenderness. Left sinus exhibits no frontal sinus tenderness.  Mouth/Throat: No oropharyngeal exudate or posterior oropharyngeal erythema.  Neck: No Brudzinski's sign noted.  Pulmonary/Chest: No respiratory distress (:Slight bronchoalveolar changes). She has wheezes.  Lymphadenopathy:       Head (right side): No preauricular adenopathy present.       Head (left side): No preauricular adenopathy present.       Right cervical: No superficial cervical adenopathy present.      Left cervical: No superficial cervical adenopathy present.  Lab Results  Component Value Date   HGBA1C 6.0 05/29/2015   HGBA1C 7.9 02/26/2015    Lab Results  Component Value Date   WBC 5.8 02/26/2015   HGB 15.1 02/26/2015   HCT 46.3 02/26/2015   GLUCOSE 178* 04/09/2015   CHOL 199 02/26/2015   TRIG 291* 02/26/2015   HDL 30* 02/26/2015   LDLCALC 111* 02/26/2015   ALT 83* 04/09/2015   AST 90* 04/09/2015   NA 138 04/09/2015   K 4.6  04/09/2015   CL 98 04/09/2015   CREATININE 0.75 04/09/2015   BUN 9 04/09/2015   CO2 25 04/09/2015   TSH 1.220 02/26/2015   HGBA1C 6.0 05/29/2015    US Abdomen Complete  03/07/2015   CLINICAL DATA:  Upper abdominal pain and nausea  EXAM: ULTRASOUND ABDOMEN COMPLETE  COMPARISON:  None.  FINDINGS: Gallbladder: The gallbladder is adequately distended with no evidence of stones, wall thickening, or pericholecystic fluid. There is no positive sonographic Murphy's sign.  Common bile duct: Diameter: 4.2 mm  Liver: The hepatic echotexture is mildly increased diffusely. There is no focal mass or ductal dilation. The surface contour is normal where visualized.  IVC: No abnormality visualized.  Pancreas: Visualized portion unremarkable.  Spleen: Size and appearance within normal limits.  Right Kidney: Length: 11.7 cm. Echogenicity within normal limits. No mass or hydronephrosis visualized.  Left Kidney: Length: 10.8 cm. Echogenicity within normal limits. No mass or hydronephrosis visualized.  Abdominal aorta: No aneurysm visualized. There is mild failure to taper of the aortic caliber.  Other findings: There is no ascites.  IMPRESSION: 1. Fatty infiltrative change of the liver. No acute gallbladder pathology is demonstrated. 2. No acute abnormality is demonstrated elsewhere.   Electronically Signed   By: David  [place] M.D.   On: 03/07/2015 08:52    Assessment & Plan:   Trinisha was seen today for uri.  Diagnoses and all orders for this visit:  Acute bronchitis due to Haemophilus influenzae  Other orders -     chlorpheniramine-HYDROcodone (TUSSIONEX PENNKINETIC ER) 10-8 MG/5ML SUER; Take 5 mLs by mouth every 12 (twelve) hours as needed for cough. -     levofloxacin (LEVAQUIN) 500 MG tablet; Take 1 tablet (500 mg total) by mouth daily.   I have discontinued Ms. Desta's Vitamin D (Ergocalciferol). I am also having her start on chlorpheniramine-HYDROcodone and levofloxacin. Additionally, I am having her  maintain her HYDROcodone-acetaminophen, esomeprazole, lisinopril, Linagliptin-Metformin HCl, and KOMBIGLYZE XR.  Meds ordered this encounter  Medications  . chlorpheniramine-HYDROcodone (TUSSIONEX PENNKINETIC ER) 10-8 MG/5ML SUER    Sig: Take 5 mLs by mouth every 12 (twelve) hours as needed for cough.    Dispense:  140 mL    Refill:  0  . levofloxacin (LEVAQUIN) 500 MG tablet    Sig: Take 1 tablet (500 mg total) by mouth daily.    Dispense:  7 tablet    Refill:  0     Follow-up: Return if symptoms worsen or fail to improve.  Mechele Claude, M.D.

## 2015-08-21 ENCOUNTER — Other Ambulatory Visit: Payer: Self-pay | Admitting: Family Medicine

## 2015-09-02 ENCOUNTER — Ambulatory Visit (INDEPENDENT_AMBULATORY_CARE_PROVIDER_SITE_OTHER): Payer: 59 | Admitting: Family Medicine

## 2015-09-02 ENCOUNTER — Encounter: Payer: Self-pay | Admitting: Family Medicine

## 2015-09-02 VITALS — Temp 98.0°F | Ht 67.0 in | Wt 219.2 lb

## 2015-09-02 DIAGNOSIS — T464X5A Adverse effect of angiotensin-converting-enzyme inhibitors, initial encounter: Secondary | ICD-10-CM

## 2015-09-02 DIAGNOSIS — Z23 Encounter for immunization: Secondary | ICD-10-CM

## 2015-09-02 DIAGNOSIS — E756 Lipid storage disorder, unspecified: Secondary | ICD-10-CM | POA: Diagnosis not present

## 2015-09-02 DIAGNOSIS — R05 Cough: Secondary | ICD-10-CM

## 2015-09-02 DIAGNOSIS — E1169 Type 2 diabetes mellitus with other specified complication: Secondary | ICD-10-CM | POA: Insufficient documentation

## 2015-09-02 DIAGNOSIS — E119 Type 2 diabetes mellitus without complications: Secondary | ICD-10-CM

## 2015-09-02 DIAGNOSIS — G4700 Insomnia, unspecified: Secondary | ICD-10-CM

## 2015-09-02 LAB — POCT GLYCOSYLATED HEMOGLOBIN (HGB A1C): Hemoglobin A1C: 5.9

## 2015-09-02 MED ORDER — LINAGLIPTIN 5 MG PO TABS: 5.0000 mg | ORAL_TABLET | Freq: Every day | ORAL | Status: DC

## 2015-09-02 MED ORDER — CARVEDILOL 3.125 MG PO TABS: 3.1250 mg | ORAL_TABLET | Freq: Two times a day (BID) | ORAL | Status: DC

## 2015-09-02 MED ORDER — METFORMIN HCL 500 MG PO TABS: 500.0000 mg | ORAL_TABLET | Freq: Two times a day (BID) | ORAL | Status: DC

## 2015-09-02 MED ORDER — TRAZODONE HCL 150 MG PO TABS: ORAL_TABLET | ORAL | Status: DC

## 2015-09-02 NOTE — Progress Notes (Signed)
Subjective:  Patient ID: Casey Norman, female    DOB: 1976/07/22  Age: 39 y.o. MRN: [id number]  CC: Diabetes   HPI Casey Norman presents forFollow-up of diabetes. Patient does check blood sugar at home Patient denies symptoms such as polyuria, polydipsia, excessive hunger, nausea No significant hypoglycemic spells noted. Medications as noted below. Taking them regularly without complication/adverse reaction being reported today.   Waking up during night. Can't get back to sleep. Lays there thinking of everything from the day. MAy take 2 hours. Getting 4-5 hours.   LDL was a bit high (111)  on Lipid profile 6 months ago. Not on lipid agent.  History Casey Norman has no past medical history on file.   She has no past surgical history on file.   Her family history includes Cancer in her father and paternal grandfather. There is no history of Stroke.She reports that she has been smoking.  She does not have any smokeless tobacco history on file. She reports that she drinks alcohol. She reports that she does not use illicit drugs.  Current Outpatient Prescriptions on File Prior to Visit  Medication Sig Dispense Refill  . esomeprazole (NEXIUM) 40 MG capsule Take 1 capsule (40 mg total) by mouth daily. 30 capsule 3   No current facility-administered medications on file prior to visit.    ROS Review of Systems  Constitutional: Negative for fever, chills, diaphoresis, appetite change, fatigue and unexpected weight change.  HENT: Negative for congestion, ear pain, hearing loss, postnasal drip, rhinorrhea, sneezing, sore throat and trouble swallowing.   Eyes: Negative for pain.  Respiratory: Negative for cough, chest tightness and shortness of breath.   Cardiovascular: Negative for chest pain and palpitations.  Gastrointestinal: Negative for nausea, vomiting, abdominal pain, diarrhea and constipation.  Genitourinary: Negative for dysuria, frequency and menstrual problem.  Musculoskeletal:  Negative for joint swelling and arthralgias.  Skin: Negative for rash.  Neurological: Negative for dizziness, weakness, numbness and headaches.  Psychiatric/Behavioral: Positive for sleep disturbance. Negative for dysphoric mood and agitation.    Objective:  Temp(Src) 98 F (36.7 C) (Oral)  Ht _0  (1.702 m)  Wt 219 lb 3.2 oz (99.428 kg)  BMI 34.32 kg/m2  LMP 08/25/2015  BP Readings from Last 3 Encounters:  07/22/15 140/85  05/22/15 134/92  04/09/15 156/87    Wt Readings from Last 3 Encounters:  09/02/15 219 lb 3.2 oz (99.428 kg)  07/22/15 225 lb 3.2 oz (102.15 kg)  05/22/15 227 lb 3.2 oz (103.057 kg)     Physical Exam  Constitutional: She is oriented to person, place, and time. She appears well-developed and well-nourished. No distress.  HENT:  Head: Normocephalic and atraumatic.  Right Ear: External ear normal.  Left Ear: External ear normal.  Nose: Nose normal.  Mouth/Throat: Oropharynx is clear and moist.  Eyes: Conjunctivae and EOM are normal. Pupils are equal, round, and reactive to light.  Neck: Normal range of motion. Neck supple. No thyromegaly present.  Cardiovascular: Normal rate, regular rhythm and normal heart sounds.   No murmur heard. Pulmonary/Chest: Effort normal and breath sounds normal. No respiratory distress. She has no wheezes. She has no rales.  Abdominal: Soft. Bowel sounds are normal. She exhibits no distension. There is no tenderness.  Lymphadenopathy:    She has no cervical adenopathy.  Neurological: She is alert and oriented to person, place, and time. She has normal reflexes.  Skin: Skin is warm and dry.  Psychiatric: She has a normal mood and affect. Her behavior is normal.  Judgment and thought content normal.    Lab Results  Component Value Date   HGBA1C 5.9 09/02/2015   HGBA1C 6.0 05/29/2015   HGBA1C 7.9 02/26/2015    Lab Results  Component Value Date   WBC 5.7 09/02/2015   HGB 15.1 02/26/2015   HCT 46.9* 09/02/2015    GLUCOSE 127* 09/02/2015   CHOL 211* 09/02/2015   TRIG 310* 09/02/2015   HDL 37* 09/02/2015   LDLCALC 112* 09/02/2015   ALT 54* 09/02/2015   AST 53* 09/02/2015   NA 140 09/02/2015   K 4.4 09/02/2015   CL 97 09/02/2015   CREATININE 0.76 09/02/2015   BUN 7 09/02/2015   CO2 25 09/02/2015   TSH 1.220 02/26/2015   HGBA1C 5.9 09/02/2015     Assessment & Plan:   Casey Norman was seen today for diabetes.  Diagnoses and all orders for this visit:  Controlled type 2 diabetes mellitus without complication, without long-term current use of insulin (HCC) -     CMP14+EGFR -     CBC with Differential/Platelet -     POCT glycosylated hemoglobin (Hb A1C) -     Microalbumin, urine -     carvedilol (COREG) 3.125 MG tablet; Take 1 tablet (3.125 mg total) by mouth 2 (two) times daily with a meal. -     linagliptin (TRADJENTA) 5 MG TABS tablet; Take 1 tablet (5 mg total) by mouth daily. For diabetes -     metFORMIN (GLUCOPHAGE) 500 MG tablet; Take 1 tablet (500 mg total) by mouth 2 (two) times daily with a meal.  Insomnia -     CBC with Differential/Platelet -     traZODone (DESYREL) 150 MG tablet; 1 or 2 at bedtime for sleep  Cough due to ACE inhibitor  Diabetic lipidosis (HCC) -     Lipid panel -     POCT glycosylated hemoglobin (Hb A1C) -     Microalbumin, urine   I have discontinued Casey Norman's HYDROcodone-acetaminophen, lisinopril, Linagliptin-Metformin HCl, KOMBIGLYZE XR, chlorpheniramine-HYDROcodone, levofloxacin, and KOMBIGLYZE XR. I am also having her start on traZODone, carvedilol, linagliptin, and metFORMIN. Additionally, I am having her maintain her esomeprazole.  Meds ordered this encounter  Medications  . traZODone (DESYREL) 150 MG tablet    Sig: 1 or 2 at bedtime for sleep    Dispense:  90 tablet    Refill:  3  . carvedilol (COREG) 3.125 MG tablet    Sig: Take 1 tablet (3.125 mg total) by mouth 2 (two) times daily with a meal.    Dispense:  60 tablet    Refill:  3  .  linagliptin (TRADJENTA) 5 MG TABS tablet    Sig: Take 1 tablet (5 mg total) by mouth daily. For diabetes    Dispense:  30 tablet    Refill:  5  . metFORMIN (GLUCOPHAGE) 500 MG tablet    Sig: Take 1 tablet (500 mg total) by mouth 2 (two) times daily with a meal.    Dispense:  180 tablet    Refill:  3     Follow-up: Return in about 3 months (around 12/03/2015) for diabetes, cholesterol.  [name], M.D.

## 2015-09-02 NOTE — Patient Instructions (Signed)

## 2015-09-03 ENCOUNTER — Encounter: Payer: Self-pay | Admitting: Family Medicine

## 2015-09-03 DIAGNOSIS — E782 Mixed hyperlipidemia: Secondary | ICD-10-CM | POA: Insufficient documentation

## 2015-09-03 LAB — CMP14+EGFR
ALT: 54 IU/L — ABNORMAL HIGH (ref 0–32)
AST: 53 IU/L — ABNORMAL HIGH (ref 0–40)
Albumin/Globulin Ratio: 1.5 (ref 1.1–2.5)
Albumin: 4.4 g/dL (ref 3.5–5.5)
Alkaline Phosphatase: 71 IU/L (ref 39–117)
BUN/Creatinine Ratio: 9 (ref 9–23)
BUN: 7 mg/dL (ref 6–24)
Bilirubin Total: 0.5 mg/dL (ref 0.0–1.2)
CO2: 25 mmol/L (ref 18–29)
Calcium: 9.8 mg/dL (ref 8.7–10.2)
Chloride: 97 mmol/L (ref 97–106)
Creatinine, Ser: 0.76 mg/dL (ref 0.57–1.00)
GFR calc Af Amer: 113 mL/min/{1.73_m2} (ref >59)
GFR calc non Af Amer: 98 mL/min/{1.73_m2} (ref >59)
Globulin, Total: 2.9 g/dL (ref 1.5–4.5)
Glucose: 127 mg/dL — ABNORMAL HIGH (ref 65–99)
Potassium: 4.4 mmol/L (ref 3.5–5.2)
Sodium: 140 mmol/L (ref 136–144)
Total Protein: 7.3 g/dL (ref 6.0–8.5)

## 2015-09-03 LAB — CBC WITH DIFFERENTIAL/PLATELET
Basophils Absolute: 0.1 10*3/uL (ref 0.0–0.2)
Basos: 1 %
EOS (ABSOLUTE): 0.2 10*3/uL (ref 0.0–0.4)
Eos: 4 %
Hematocrit: 46.9 % — ABNORMAL HIGH (ref 34.0–46.6)
Hemoglobin: 16.4 g/dL — ABNORMAL HIGH (ref 11.1–15.9)
Immature Grans (Abs): 0 10*3/uL (ref 0.0–0.1)
Immature Granulocytes: 0 %
Lymphocytes Absolute: 1.8 10*3/uL (ref 0.7–3.1)
Lymphs: 31 %
MCH: 36 pg — ABNORMAL HIGH (ref 26.6–33.0)
MCHC: 35 g/dL (ref 31.5–35.7)
MCV: 103 fL — ABNORMAL HIGH (ref 79–97)
Monocytes Absolute: 0.5 10*3/uL (ref 0.1–0.9)
Monocytes: 9 %
Neutrophils Absolute: 3.1 10*3/uL (ref 1.4–7.0)
Neutrophils: 55 %
Platelets: 212 10*3/uL (ref 150–379)
RBC: 4.55 x10E6/uL (ref 3.77–5.28)
RDW: 12.2 % — ABNORMAL LOW (ref 12.3–15.4)
WBC: 5.7 10*3/uL (ref 3.4–10.8)

## 2015-09-03 LAB — LIPID PANEL
Chol/HDL Ratio: 5.7 ratio units — ABNORMAL HIGH (ref 0.0–4.4)
Cholesterol, Total: 211 mg/dL — ABNORMAL HIGH (ref 100–199)
HDL: 37 mg/dL — ABNORMAL LOW (ref >39)
LDL Calculated: 112 mg/dL — ABNORMAL HIGH (ref 0–99)
Triglycerides: 310 mg/dL — ABNORMAL HIGH (ref 0–149)
VLDL Cholesterol Cal: 62 mg/dL — ABNORMAL HIGH (ref 5–40)

## 2015-09-03 LAB — MICROALBUMIN, URINE: Microalbumin, Urine: 50.7 ug/mL

## 2015-09-03 NOTE — Progress Notes (Signed)
Patient aware.

## 2015-12-03 ENCOUNTER — Ambulatory Visit: Payer: 59 | Admitting: Family Medicine

## 2015-12-05 ENCOUNTER — Encounter: Payer: Self-pay | Admitting: Family Medicine

## 2015-12-08 ENCOUNTER — Telehealth: Payer: Self-pay | Admitting: Family Medicine

## 2015-12-08 NOTE — Telephone Encounter (Signed)
Scheduled

## 2015-12-15 ENCOUNTER — Ambulatory Visit (INDEPENDENT_AMBULATORY_CARE_PROVIDER_SITE_OTHER): Payer: BLUE CROSS/BLUE SHIELD | Admitting: Family Medicine

## 2015-12-15 ENCOUNTER — Encounter: Payer: Self-pay | Admitting: Family Medicine

## 2015-12-15 VITALS — BP 135/81 | HR 84 | Temp 98.4°F | Ht 67.0 in | Wt 220.4 lb

## 2015-12-15 DIAGNOSIS — E785 Hyperlipidemia, unspecified: Secondary | ICD-10-CM | POA: Diagnosis not present

## 2015-12-15 DIAGNOSIS — E119 Type 2 diabetes mellitus without complications: Secondary | ICD-10-CM | POA: Diagnosis not present

## 2015-12-15 DIAGNOSIS — R7309 Other abnormal glucose: Secondary | ICD-10-CM | POA: Diagnosis not present

## 2015-12-15 DIAGNOSIS — R5383 Other fatigue: Secondary | ICD-10-CM

## 2015-12-15 LAB — POCT GLYCOSYLATED HEMOGLOBIN (HGB A1C): Hemoglobin A1C: 5.6

## 2015-12-15 MED ORDER — VARENICLINE TARTRATE 0.5 MG X 11 & 1 MG X 42 PO MISC: ORAL | Status: DC

## 2015-12-15 NOTE — Progress Notes (Signed)
+  Subjective:  Patient ID: Casey Norman, female    DOB: [birth year]  Age: 40 y.o. MRN: [id number]  CC: Diabetes; Hypertension; and Hyperlipidemia   HPI Casey Norman presents for  follow-up of hypertension. Patient has no history of headache chest pain or shortness of breath or recent cough. Patient also denies symptoms of TIA such as numbness weakness lateralizing. Patient checks  blood pressure at home and has not had any elevated readings recently. Patient denies side effects from his medication. States taking it regularly.  Patient also  in for follow-up of elevated cholesterol.DCed statin due to muscle aches after 1 month of use.  Follow-up of diabetes. Patient does check blood sugar at home. Readings run between 110 and 160 Patient denies symptoms such as polyuria, polydipsia, excessive hunger, nausea No significant hypoglycemic spells noted. Medications as noted below. Taking them regularly without complication/adverse reaction being reported today.  Smoking most of a pack per day.   History Casey Norman has no past medical history on file.   She has no past surgical history on file.   Her family history includes Cancer in her father and paternal grandfather. There is no history of Stroke.She reports that she has been smoking.  She does not have any smokeless tobacco history on file. She reports that she drinks alcohol. She reports that she does not use illicit drugs.  Current Outpatient Prescriptions on File Prior to Visit  Medication Sig Dispense Refill  . esomeprazole (NEXIUM) 40 MG capsule Take 1 capsule (40 mg total) by mouth daily. 30 capsule 3  . metFORMIN (GLUCOPHAGE) 500 MG tablet Take 1 tablet (500 mg total) by mouth 2 (two) times daily with a meal. 180 tablet 3  . traZODone (DESYREL) 150 MG tablet 1 or 2 at bedtime for sleep 90 tablet 3   No current facility-administered medications on file prior to visit.    ROS Review of Systems  Constitutional: Negative for fever,  activity change and appetite change.  HENT: Negative for congestion, rhinorrhea and sore throat.   Eyes: Negative for visual disturbance.  Respiratory: Negative for cough and shortness of breath.   Cardiovascular: Negative for chest pain and palpitations.  Gastrointestinal: Negative for nausea, abdominal pain and diarrhea.  Genitourinary: Negative for dysuria.  Musculoskeletal: Negative for myalgias and arthralgias.    Objective:  BP 135/81 mmHg  Pulse 84  Temp(Src) 98.4 F (36.9 C) (Oral)  Ht 5' 7"  (1.702 m)  Wt 220 lb 6.4 oz (99.973 kg)  BMI 34.51 kg/m2  SpO2 96%  LMP 12/13/2015  BP Readings from Last 3 Encounters:  12/15/15 135/81  07/22/15 140/85  05/22/15 134/92    Wt Readings from Last 3 Encounters:  12/15/15 220 lb 6.4 oz (99.973 kg)  09/02/15 219 lb 3.2 oz (99.428 kg)  07/22/15 225 lb 3.2 oz (102.15 kg)     Physical Exam  Constitutional: She is oriented to person, place, and time. She appears well-developed and well-nourished. No distress.  HENT:  Head: Normocephalic and atraumatic.  Right Ear: External ear normal.  Left Ear: External ear normal.  Nose: Nose normal.  Mouth/Throat: Oropharynx is clear and moist.  Eyes: Conjunctivae and EOM are normal. Pupils are equal, round, and reactive to light.  Neck: Normal range of motion. Neck supple. No thyromegaly present.  Cardiovascular: Normal rate, regular rhythm and normal heart sounds.   No murmur heard. Pulmonary/Chest: Effort normal and breath sounds normal. No respiratory distress. She has no wheezes. She has no rales.  Abdominal: Soft. Bowel  sounds are normal. She exhibits no distension. There is no tenderness.  Lymphadenopathy:    She has no cervical adenopathy.  Neurological: She is alert and oriented to person, place, and time. She has normal reflexes.  Skin: Skin is warm and dry.  Psychiatric: She has a normal mood and affect. Her behavior is normal. Judgment and thought content normal.    Lab  Results  Component Value Date   HGBA1C 5.6 12/15/2015   HGBA1C 5.9 09/02/2015   HGBA1C 6.0 05/29/2015    Lab Results  Component Value Date   WBC 5.7 09/02/2015   HGB 15.1 02/26/2015   HCT 46.9* 09/02/2015   PLT 212 09/02/2015   GLUCOSE 127* 09/02/2015   CHOL 211* 09/02/2015   TRIG 310* 09/02/2015   HDL 37* 09/02/2015   LDLCALC 112* 09/02/2015   ALT 54* 09/02/2015   AST 53* 09/02/2015   NA 140 09/02/2015   K 4.4 09/02/2015   CL 97 09/02/2015   CREATININE 0.76 09/02/2015   BUN 7 09/02/2015   CO2 25 09/02/2015   TSH 1.220 02/26/2015   HGBA1C 5.6 12/15/2015    US Abdomen Complete  03/07/2015  CLINICAL DATA:  Upper abdominal pain and nausea EXAM: ULTRASOUND ABDOMEN COMPLETE COMPARISON:  None. FINDINGS: Gallbladder: The gallbladder is adequately distended with no evidence of stones, wall thickening, or pericholecystic fluid. There is no positive sonographic Murphy's sign. Common bile duct: Diameter: 4.2 mm Liver: The hepatic echotexture is mildly increased diffusely. There is no focal mass or ductal dilation. The surface contour is normal where visualized. IVC: No abnormality visualized. Pancreas: Visualized portion unremarkable. Spleen: Size and appearance within normal limits. Right Kidney: Length: 11.7 cm. Echogenicity within normal limits. No mass or hydronephrosis visualized. Left Kidney: Length: 10.8 cm. Echogenicity within normal limits. No mass or hydronephrosis visualized. Abdominal aorta: No aneurysm visualized. There is mild failure to taper of the aortic caliber. Other findings: There is no ascites. IMPRESSION: 1. Fatty infiltrative change of the liver. No acute gallbladder pathology is demonstrated. 2. No acute abnormality is demonstrated elsewhere. Electronically Signed   By: David  Martinique M.D.   On: 03/07/2015 08:52    Assessment & Plan:   Casey Norman was seen today for diabetes, hypertension and hyperlipidemia.  Diagnoses and all orders for this  visit:  Hyperlipidemia  Elevated random blood glucose level -     CMP14+EGFR -     Lipid panel -     POCT glycosylated hemoglobin (Hb A1C); Standing -     POCT glycosylated hemoglobin (Hb A1C)  Other fatigue -     CMP14+EGFR -     CBC with Differential/Platelet; Standing -     CBC with Differential/Platelet  Controlled type 2 diabetes mellitus without complication, without long-term current use of insulin (HCC)  Other orders -     varenicline (CHANTIX STARTING MONTH PAK) 0.5 MG X 11 & 1 MG X 42 tablet; Take one 0.5 mg tablet by mouth once daily for 3 days, then increase to one 0.5 mg tablet twice daily for 4 days, then increase to one 1 mg tablet twice daily.   I have discontinued Casey Norman's carvedilol and linagliptin. I am also having her start on varenicline. Additionally, I am having her maintain her esomeprazole, traZODone, and metFORMIN.  Meds ordered this encounter  Medications  . varenicline (CHANTIX STARTING MONTH PAK) 0.5 MG X 11 & 1 MG X 42 tablet    Sig: Take one 0.5 mg tablet by mouth once daily for 3  days, then increase to one 0.5 mg tablet twice daily for 4 days, then increase to one 1 mg tablet twice daily.    Dispense:  53 tablet    Refill:  0     Follow-up: Return in about 3 months (around 03/13/2016) for diabetes.  [name], M.D.

## 2015-12-16 LAB — LIPID PANEL
Chol/HDL Ratio: 7.1 ratio units — ABNORMAL HIGH (ref 0.0–4.4)
Cholesterol, Total: 193 mg/dL (ref 100–199)
HDL: 27 mg/dL — ABNORMAL LOW (ref >39)
Triglycerides: 485 mg/dL — ABNORMAL HIGH (ref 0–149)

## 2015-12-16 LAB — CBC WITH DIFFERENTIAL/PLATELET
Basophils Absolute: 0.1 10*3/uL (ref 0.0–0.2)
Basos: 1 %
EOS (ABSOLUTE): 0.2 10*3/uL (ref 0.0–0.4)
Eos: 3 %
Hematocrit: 45.4 % (ref 34.0–46.6)
Hemoglobin: 15.6 g/dL (ref 11.1–15.9)
Immature Grans (Abs): 0 10*3/uL (ref 0.0–0.1)
Immature Granulocytes: 0 %
Lymphocytes Absolute: 2.2 10*3/uL (ref 0.7–3.1)
Lymphs: 28 %
MCH: 35.4 pg — ABNORMAL HIGH (ref 26.6–33.0)
MCHC: 34.4 g/dL (ref 31.5–35.7)
MCV: 103 fL — ABNORMAL HIGH (ref 79–97)
Monocytes Absolute: 0.4 10*3/uL (ref 0.1–0.9)
Monocytes: 6 %
Neutrophils Absolute: 4.7 10*3/uL (ref 1.4–7.0)
Neutrophils: 62 %
Platelets: 218 10*3/uL (ref 150–379)
RBC: 4.41 x10E6/uL (ref 3.77–5.28)
RDW: 12.2 % — ABNORMAL LOW (ref 12.3–15.4)
WBC: 7.6 10*3/uL (ref 3.4–10.8)

## 2015-12-16 LAB — CMP14+EGFR
ALT: 37 IU/L — ABNORMAL HIGH (ref 0–32)
AST: 31 IU/L (ref 0–40)
Albumin/Globulin Ratio: 1.6 (ref 1.1–2.5)
Albumin: 4.2 g/dL (ref 3.5–5.5)
Alkaline Phosphatase: 64 IU/L (ref 39–117)
BUN/Creatinine Ratio: 17 (ref 9–23)
BUN: 12 mg/dL (ref 6–24)
Bilirubin Total: <0.2 mg/dL (ref 0.0–1.2)
CO2: 22 mmol/L (ref 18–29)
Calcium: 9 mg/dL (ref 8.7–10.2)
Chloride: 99 mmol/L (ref 96–106)
Creatinine, Ser: 0.72 mg/dL (ref 0.57–1.00)
GFR calc Af Amer: 120 mL/min/{1.73_m2} (ref >59)
GFR calc non Af Amer: 104 mL/min/{1.73_m2} (ref >59)
Globulin, Total: 2.7 g/dL (ref 1.5–4.5)
Glucose: 101 mg/dL — ABNORMAL HIGH (ref 65–99)
Potassium: 4.4 mmol/L (ref 3.5–5.2)
Sodium: 138 mmol/L (ref 134–144)
Total Protein: 6.9 g/dL (ref 6.0–8.5)

## 2015-12-18 NOTE — Progress Notes (Signed)
Patient aware.

## 2016-01-05 ENCOUNTER — Telehealth: Payer: Self-pay | Admitting: Family Medicine

## 2016-01-06 NOTE — Telephone Encounter (Signed)
Denied.

## 2016-03-03 LAB — HM DIABETES EYE EXAM

## 2016-03-17 ENCOUNTER — Ambulatory Visit: Payer: BLUE CROSS/BLUE SHIELD | Admitting: Family Medicine

## 2016-03-24 ENCOUNTER — Ambulatory Visit (INDEPENDENT_AMBULATORY_CARE_PROVIDER_SITE_OTHER): Payer: BLUE CROSS/BLUE SHIELD | Admitting: Family Medicine

## 2016-03-24 ENCOUNTER — Encounter: Payer: Self-pay | Admitting: Family Medicine

## 2016-03-24 VITALS — BP 137/85 | HR 87 | Temp 98.4°F | Ht 67.0 in | Wt 223.0 lb

## 2016-03-24 DIAGNOSIS — G4700 Insomnia, unspecified: Secondary | ICD-10-CM | POA: Diagnosis not present

## 2016-03-24 DIAGNOSIS — E1169 Type 2 diabetes mellitus with other specified complication: Secondary | ICD-10-CM | POA: Diagnosis not present

## 2016-03-24 DIAGNOSIS — E119 Type 2 diabetes mellitus without complications: Secondary | ICD-10-CM | POA: Diagnosis not present

## 2016-03-24 DIAGNOSIS — E785 Hyperlipidemia, unspecified: Secondary | ICD-10-CM | POA: Diagnosis not present

## 2016-03-24 DIAGNOSIS — E756 Lipid storage disorder, unspecified: Secondary | ICD-10-CM | POA: Diagnosis not present

## 2016-03-24 LAB — LIPID PANEL
Chol/HDL Ratio: 4.6 ratio units — ABNORMAL HIGH (ref 0.0–4.4)
Cholesterol, Total: 187 mg/dL (ref 100–199)
HDL: 41 mg/dL (ref >39)
LDL Calculated: 94 mg/dL (ref 0–99)
Triglycerides: 258 mg/dL — ABNORMAL HIGH (ref 0–149)
VLDL Cholesterol Cal: 52 mg/dL — ABNORMAL HIGH (ref 5–40)

## 2016-03-24 LAB — CMP14+EGFR
ALT: 36 IU/L — ABNORMAL HIGH (ref 0–32)
AST: 39 IU/L (ref 0–40)
Albumin/Globulin Ratio: 1.5 (ref 1.2–2.2)
Albumin: 4.1 g/dL (ref 3.5–5.5)
Alkaline Phosphatase: 49 IU/L (ref 39–117)
BUN/Creatinine Ratio: 10 (ref 9–23)
BUN: 8 mg/dL (ref 6–24)
Bilirubin Total: 0.4 mg/dL (ref 0.0–1.2)
CO2: 23 mmol/L (ref 18–29)
Calcium: 9.4 mg/dL (ref 8.7–10.2)
Chloride: 100 mmol/L (ref 96–106)
Creatinine, Ser: 0.78 mg/dL (ref 0.57–1.00)
GFR calc Af Amer: 109 mL/min/{1.73_m2} (ref >59)
GFR calc non Af Amer: 95 mL/min/{1.73_m2} (ref >59)
Globulin, Total: 2.8 g/dL (ref 1.5–4.5)
Glucose: 108 mg/dL — ABNORMAL HIGH (ref 65–99)
Potassium: 4 mmol/L (ref 3.5–5.2)
Sodium: 139 mmol/L (ref 134–144)
Total Protein: 6.9 g/dL (ref 6.0–8.5)

## 2016-03-24 LAB — URINALYSIS
Bilirubin, UA: NEGATIVE
Glucose, UA: NEGATIVE
Ketones, UA: NEGATIVE
Protein, UA: NEGATIVE
Specific Gravity, UA: 1.02 (ref 1.005–1.030)
Urobilinogen, Ur: 0.2 mg/dL (ref 0.2–1.0)
pH, UA: 7 (ref 5.0–7.5)

## 2016-03-24 LAB — CBC WITH DIFFERENTIAL/PLATELET
Basophils Absolute: 0 10*3/uL (ref 0.0–0.2)
Basos: 1 %
EOS (ABSOLUTE): 0.2 10*3/uL (ref 0.0–0.4)
Eos: 4 %
Hematocrit: 43 % (ref 34.0–46.6)
Hemoglobin: 15.3 g/dL (ref 11.1–15.9)
Immature Grans (Abs): 0 10*3/uL (ref 0.0–0.1)
Immature Granulocytes: 0 %
Lymphocytes Absolute: 1.7 10*3/uL (ref 0.7–3.1)
Lymphs: 36 %
MCH: 35.6 pg — ABNORMAL HIGH (ref 26.6–33.0)
MCHC: 35.6 g/dL (ref 31.5–35.7)
MCV: 100 fL — ABNORMAL HIGH (ref 79–97)
Monocytes Absolute: 0.3 10*3/uL (ref 0.1–0.9)
Monocytes: 7 %
Neutrophils Absolute: 2.4 10*3/uL (ref 1.4–7.0)
Neutrophils: 52 %
Platelets: 193 10*3/uL (ref 150–379)
RBC: 4.3 x10E6/uL (ref 3.77–5.28)
RDW: 11.9 % — ABNORMAL LOW (ref 12.3–15.4)
WBC: 4.7 10*3/uL (ref 3.4–10.8)

## 2016-03-24 LAB — BAYER DCA HB A1C WAIVED: HB A1C (BAYER DCA - WAIVED): 5.7 % (ref <7.0)

## 2016-03-24 MED ORDER — METFORMIN HCL 500 MG PO TABS: 500.0000 mg | ORAL_TABLET | Freq: Two times a day (BID) | ORAL | Status: DC

## 2016-03-24 MED ORDER — TRAZODONE HCL 150 MG PO TABS: ORAL_TABLET | ORAL | Status: DC

## 2016-03-24 MED ORDER — VARENICLINE TARTRATE 0.5 MG X 11 & 1 MG X 42 PO MISC: ORAL | Status: DC

## 2016-03-24 MED ORDER — ESOMEPRAZOLE MAGNESIUM 40 MG PO CPDR: 40.0000 mg | DELAYED_RELEASE_CAPSULE | Freq: Every day | ORAL | Status: DC

## 2016-03-24 NOTE — Progress Notes (Signed)
Subjective:  Patient ID: Casey Norman, female    DOB: [birth year]  Age: 40 y.o. MRN: [id number]  CC: Diabetes and Gastroesophageal Reflux   HPI Casey Norman presents forFollow-up of diabetes. Patient checks blood sugar at home.  120 fasting and 150 postprandial Patient denies symptoms such as polyuria, polydipsia, excessive hunger, nausea No significant hypoglycemic spells noted. Medications as noted below. Taking them regularly without complication/adverse reaction being reported today. Checking feet daily.   History Casey Norman has no past medical history on file.   She has no past surgical history on file.   Her family history includes Cancer in her father and paternal grandfather. There is no history of Stroke.She reports that she has been smoking.  She does not have any smokeless tobacco history on file. She reports that she drinks alcohol. She reports that she does not use illicit drugs.  No current outpatient prescriptions on file prior to visit.   No current facility-administered medications on file prior to visit.    ROS Review of Systems  Constitutional: Negative for fever, activity change and appetite change.  HENT: Negative for congestion, rhinorrhea and sore throat.   Eyes: Negative for visual disturbance.  Respiratory: Negative for cough and shortness of breath.   Cardiovascular: Negative for chest pain and palpitations.  Gastrointestinal: Negative for nausea, abdominal pain and diarrhea.  Genitourinary: Negative for dysuria.  Musculoskeletal: Negative for myalgias and arthralgias.    Objective:  BP 137/85 mmHg  Pulse 87  Temp(Src) 98.4 F (36.9 C) (Oral)  Ht 5' 7"  (1.702 m)  Wt 223 lb (101.152 kg)  BMI 34.92 kg/m2  SpO2 96%  LMP 03/14/2016 (Approximate)  BP Readings from Last 3 Encounters:  03/24/16 137/85  12/15/15 135/81  07/22/15 140/85    Wt Readings from Last 3 Encounters:  03/24/16 223 lb (101.152 kg)  12/15/15 220 lb 6.4 oz (99.973 kg)    09/02/15 219 lb 3.2 oz (99.428 kg)     Physical Exam  Constitutional: She is oriented to person, place, and time. She appears well-developed and well-nourished. No distress.  HENT:  Head: Normocephalic and atraumatic.  Right Ear: External ear normal.  Left Ear: External ear normal.  Nose: Nose normal.  Mouth/Throat: Oropharynx is clear and moist.  Eyes: Conjunctivae and EOM are normal. Pupils are equal, round, and reactive to light.  Neck: Normal range of motion. Neck supple. No thyromegaly present.  Cardiovascular: Normal rate, regular rhythm and normal heart sounds.   No murmur heard. Pulmonary/Chest: Effort normal and breath sounds normal. No respiratory distress. She has no wheezes. She has no rales.  Abdominal: Soft. Bowel sounds are normal. She exhibits no distension. There is no tenderness.  Lymphadenopathy:    She has no cervical adenopathy.  Neurological: She is alert and oriented to person, place, and time. She has normal reflexes.  Skin: Skin is warm and dry.  Psychiatric: She has a normal mood and affect. Her behavior is normal. Judgment and thought content normal.    Lab Results  Component Value Date   HGBA1C 5.6 12/15/2015   HGBA1C 5.9 09/02/2015   HGBA1C 6.0 05/29/2015    Lab Results  Component Value Date   WBC 7.6 12/15/2015   HGB 15.1 02/26/2015   HCT 45.4 12/15/2015   PLT 218 12/15/2015   GLUCOSE 101* 12/15/2015   CHOL 193 12/15/2015   TRIG 485* 12/15/2015   HDL 27* 12/15/2015   LDLCALC Comment 12/15/2015   ALT 37* 12/15/2015   AST 31 12/15/2015  NA 138 12/15/2015   K 4.4 12/15/2015   CL 99 12/15/2015   CREATININE 0.72 12/15/2015   BUN 12 12/15/2015   CO2 22 12/15/2015   TSH 1.220 02/26/2015   HGBA1C 5.6 12/15/2015     Assessment & Plan:   Casey Norman was seen today for diabetes and gastroesophageal reflux.  Diagnoses and all orders for this visit:  Diabetic lipidosis ([facility])  Controlled type 2 diabetes mellitus without complication,  without long-term current use of insulin (HCC) -     Bayer DCA Hb A1c Waived -     CBC with Differential/Platelet -     CMP14+EGFR -     Microalbumin / creatinine urine ratio -     Urinalysis -     metFORMIN (GLUCOPHAGE) 500 MG tablet; Take 1 tablet (500 mg total) by mouth 2 (two) times daily with a meal.  Hyperlipidemia -     CBC with Differential/Platelet -     Lipid panel  Insomnia -     traZODone (DESYREL) 150 MG tablet; 1 or 2 at bedtime for sleep  Other orders -     varenicline (CHANTIX STARTING MONTH PAK) 0.5 MG X 11 & 1 MG X 42 tablet; Use as directed -     esomeprazole (NEXIUM) 40 MG capsule; Take 1 capsule (40 mg total) by mouth daily.    Renew efforts to DC Smoking.  I have changed Ms. Casey Norman's varenicline. I am also having her maintain her metFORMIN, esomeprazole, and traZODone.  Meds ordered this encounter  Medications  . varenicline (CHANTIX STARTING MONTH PAK) 0.5 MG X 11 & 1 MG X 42 tablet    Sig: Use as directed    Dispense:  53 tablet    Refill:  0  . metFORMIN (GLUCOPHAGE) 500 MG tablet    Sig: Take 1 tablet (500 mg total) by mouth 2 (two) times daily with a meal.    Dispense:  180 tablet    Refill:  3  . esomeprazole (NEXIUM) 40 MG capsule    Sig: Take 1 capsule (40 mg total) by mouth daily.    Dispense:  30 capsule    Refill:  3  . traZODone (DESYREL) 150 MG tablet    Sig: 1 or 2 at bedtime for sleep    Dispense:  90 tablet    Refill:  3     Follow-up: Return in about 3 months (around 06/24/2016) for diabetes.  [name], M.D.

## 2016-03-24 NOTE — Addendum Note (Signed)
Addended by: Bearl MulberryUTHERFORD, Gauri Galvao K on: 03/24/2016 10:27 AM   Modules accepted: Orders

## 2016-03-25 LAB — MICROALBUMIN / CREATININE URINE RATIO
Creatinine, Urine: 119.6 mg/dL
MICROALB/CREAT RATIO: 38.1 mg/g creat — ABNORMAL HIGH (ref 0.0–30.0)
Microalbumin, Urine: 45.6 ug/mL

## 2016-03-26 ENCOUNTER — Other Ambulatory Visit: Payer: Self-pay | Admitting: Family Medicine

## 2016-03-26 MED ORDER — LISINOPRIL 10 MG PO TABS: 10.0000 mg | ORAL_TABLET | Freq: Every day | ORAL | Status: DC

## 2016-03-26 NOTE — Progress Notes (Signed)
Quick Note:  Hello Casey Norman,  There is a protein called microalbumin leaking out of your kidney into your urine. This is harmless, but indicates that your kidneys are under strain. This is usually from diabetes. To prevent damage, I recommend taking lisinopril. It is a once a day medication that stimulates the kidney circulation.  Best Regards, Mechele ClaudeWarren Tresa Jolley, M.D. ______

## 2016-04-02 ENCOUNTER — Telehealth: Payer: Self-pay | Admitting: Family Medicine

## 2016-04-09 ENCOUNTER — Other Ambulatory Visit: Payer: Self-pay | Admitting: *Deleted

## 2016-04-09 MED ORDER — VARENICLINE TARTRATE 0.5 MG PO TABS: 0.5000 mg | ORAL_TABLET | Freq: Two times a day (BID) | ORAL | Status: DC

## 2016-04-21 NOTE — Telephone Encounter (Signed)
Patient aware of lab results.

## 2016-06-25 ENCOUNTER — Ambulatory Visit: Payer: PRIVATE HEALTH INSURANCE | Admitting: Family Medicine

## 2016-08-27 ENCOUNTER — Ambulatory Visit (INDEPENDENT_AMBULATORY_CARE_PROVIDER_SITE_OTHER): Payer: BLUE CROSS/BLUE SHIELD | Admitting: Physician Assistant

## 2016-08-27 ENCOUNTER — Encounter: Payer: Self-pay | Admitting: Physician Assistant

## 2016-08-27 VITALS — BP 142/95 | HR 95 | Temp 97.0°F | Resp 20 | Ht 67.0 in | Wt 221.4 lb

## 2016-08-27 DIAGNOSIS — J0100 Acute maxillary sinusitis, unspecified: Secondary | ICD-10-CM | POA: Diagnosis not present

## 2016-08-27 DIAGNOSIS — J209 Acute bronchitis, unspecified: Secondary | ICD-10-CM | POA: Diagnosis not present

## 2016-08-27 MED ORDER — PREDNISONE 10 MG (21) PO TBPK: ORAL_TABLET | ORAL | 0 refills | Status: DC

## 2016-08-27 MED ORDER — DOXYCYCLINE HYCLATE 100 MG PO TABS: 100.0000 mg | ORAL_TABLET | Freq: Two times a day (BID) | ORAL | 0 refills | Status: DC

## 2016-08-27 MED ORDER — ALBUTEROL SULFATE HFA 108 (90 BASE) MCG/ACT IN AERS: 2.0000 | INHALATION_SPRAY | Freq: Four times a day (QID) | RESPIRATORY_TRACT | 0 refills | Status: DC | PRN

## 2016-08-27 NOTE — Progress Notes (Signed)
BP (!) 142/95 (BP Location: Right Arm, Patient Position: Sitting, Cuff Size: Normal)   Pulse 95   Temp 97 F (36.1 C) (Oral)   Resp 20   Ht 5\' 7"  (1.702 m)   Wt 221 lb 6.4 oz (100.4 kg)   SpO2 98%   BMI 34.68 kg/m    Subjective:    Patient ID: Casey Norman, female    DOB: [birth year], 40 y.o.   MRN: [id number]  HPI: Casey Norman is a 40 y.o. female presenting on 08/27/2016 for Cough and Dizziness Greater than one week of cough, congestion, drainage and wheezing. Has a known history of bronchitis.  Denies NVD. Admits she needs follow up with Dr Darlyn Read for her blood pressure.  She works at [employer] and has been around lots of sick people.  Relevant past medical, surgical, family and social history reviewed and updated as indicated. Allergies and medications reviewed and updated.  Past Medical History:  Diagnosis Date  . Diabetes mellitus without complication (HCC)     History reviewed. No pertinent surgical history.  Review of Systems  Constitutional: Negative.   HENT: Positive for congestion, postnasal drip and sore throat.   Eyes: Negative.   Respiratory: Positive for cough and wheezing.   Gastrointestinal: Negative.   Genitourinary: Negative.       Medication List       Accurate as of 08/27/16 11:18 AM. Always use your most recent med list.          albuterol 108 (90 Base) MCG/ACT inhaler Commonly known as:  PROVENTIL HFA;VENTOLIN HFA Inhale 2 puffs into the lungs every 6 (six) hours as needed for wheezing or shortness of breath.   doxycycline 100 MG tablet Commonly known as:  VIBRA-TABS Take 1 tablet (100 mg total) by mouth 2 (two) times daily.   esomeprazole 40 MG capsule Commonly known as:  NEXIUM Take 1 capsule (40 mg total) by mouth daily.   metFORMIN 500 MG tablet Commonly known as:  GLUCOPHAGE Take 1 tablet (500 mg total) by mouth 2 (two) times daily with a meal.   predniSONE 10 MG (21) Tbpk tablet Commonly known as:  STERAPRED UNI-PAK 21  TAB As directed x 6 days   traZODone 150 MG tablet Commonly known as:  DESYREL 1 or 2 at bedtime for sleep          Objective:    BP (!) 142/95 (BP Location: Right Arm, Patient Position: Sitting, Cuff Size: Normal)   Pulse 95   Temp 97 F (36.1 C) (Oral)   Resp 20   Ht 5\' 7"  (1.702 m)   Wt 221 lb 6.4 oz (100.4 kg)   SpO2 98%   BMI 34.68 kg/m   No Known Allergies  Physical Exam  Constitutional: She is oriented to person, place, and time. She appears well-developed and well-nourished.  HENT:  Head: Normocephalic and atraumatic.  Right Ear: There is drainage and tenderness.  Left Ear: There is drainage and tenderness.  Nose: Mucosal edema and rhinorrhea present. Right sinus exhibits maxillary sinus tenderness and frontal sinus tenderness. Left sinus exhibits maxillary sinus tenderness and frontal sinus tenderness.  Mouth/Throat: Oropharyngeal exudate and posterior oropharyngeal erythema present.  Eyes: Conjunctivae and EOM are normal. Pupils are equal, round, and reactive to light.  Neck: Normal range of motion. Neck supple.  Cardiovascular: Normal rate, regular rhythm, normal heart sounds and intact distal pulses.   Pulmonary/Chest: Effort normal. She has wheezes in the right upper field and the left upper field.  Abdominal: Soft. Bowel sounds are normal.  Neurological: She is alert and oriented to person, place, and time. She has normal reflexes.  Skin: Skin is warm and dry. No rash noted.  Psychiatric: She has a normal mood and affect. Her behavior is normal. Judgment and thought content normal.        Assessment & Plan:   1. Acute non-recurrent maxillary sinusitis - albuterol (PROVENTIL HFA;VENTOLIN HFA) 108 (90 Base) MCG/ACT inhaler; Inhale 2 puffs into the lungs every 6 (six) hours as needed for wheezing or shortness of breath.  Dispense: 1 Inhaler; Refill: 0 - doxycycline (VIBRA-TABS) 100 MG tablet; Take 1 tablet (100 mg total) by mouth 2 (two) times daily.   Dispense: 20 tablet; Refill: 0 - predniSONE (STERAPRED UNI-PAK 21 TAB) 10 MG (21) TBPK tablet; As directed x 6 days  Dispense: 21 tablet; Refill: 0  2. Acute bronchitis, unspecified organism   Continue all other maintenance medications as listed above.  Follow up plan: Return if symptoms worsen or fail to improve.  No orders of the defined types were placed in this encounter.   Educational handout given for bronchitis   Remus LofflerAngel S. Demyan Fugate PA-C [facility] [address]  [place], [place] [id number] [phone]   08/27/2016, 11:18 AM

## 2016-08-27 NOTE — Patient Instructions (Signed)

## 2016-09-29 ENCOUNTER — Telehealth: Payer: Self-pay | Admitting: Family Medicine

## 2016-09-29 MED ORDER — AMOXICILLIN-POT CLAVULANATE 875-125 MG PO TABS: 1.0000 | ORAL_TABLET | Freq: Two times a day (BID) | ORAL | 0 refills | Status: DC

## 2016-09-29 NOTE — Telephone Encounter (Signed)
I sent in the requested prescription 

## 2016-09-29 NOTE — Telephone Encounter (Signed)
Patient states that she has been having a cough, facial pain and nasal congestion since Saturday. Patient was seen 10/27 for the same symptoms and states that her sinus infection had went away but she still had her cough and then it came back on Saturday. Patient is wanting to know [facility] [place] so she will not have to miss work. Please advise and send back to the pools.

## 2016-09-29 NOTE — Telephone Encounter (Signed)
Aware, script sent in. 

## 2017-02-08 IMAGING — US US ABDOMEN COMPLETE
1 series · 14 of 25 positions shown · non-contrast
Comparison: None.

CLINICAL DATA: Upper abdominal pain and nausea

EXAM:
ULTRASOUND ABDOMEN COMPLETE

[Series 1: us abdomen complete · 0.24mm/px · 14 of 104 slices shown]
[im 1/104]
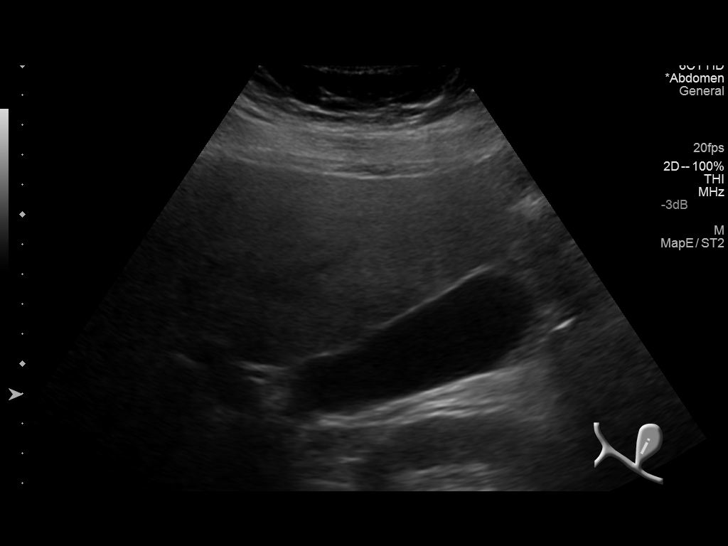
[im 9/104]
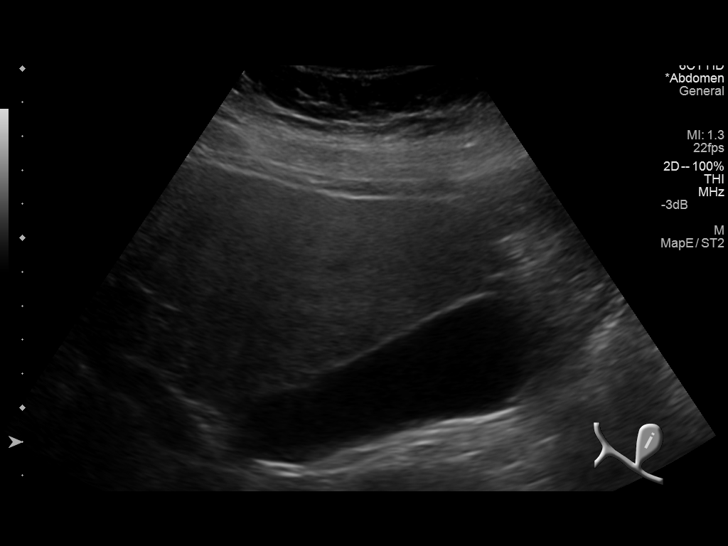
[im 18/104]
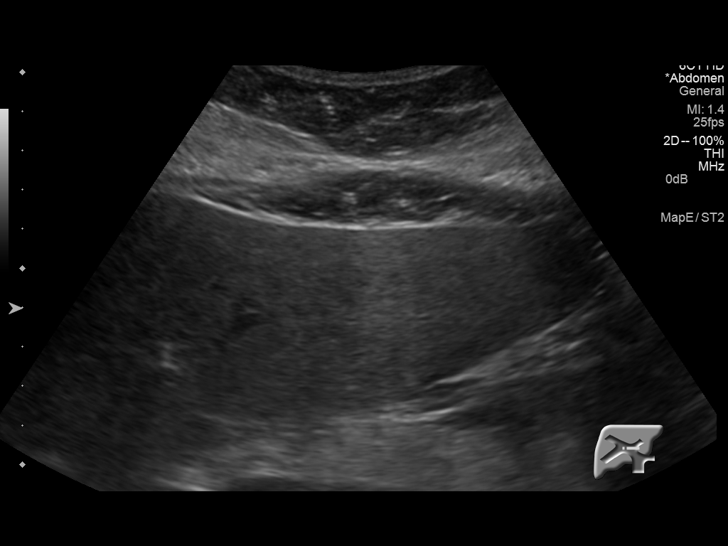
[im 26/104]
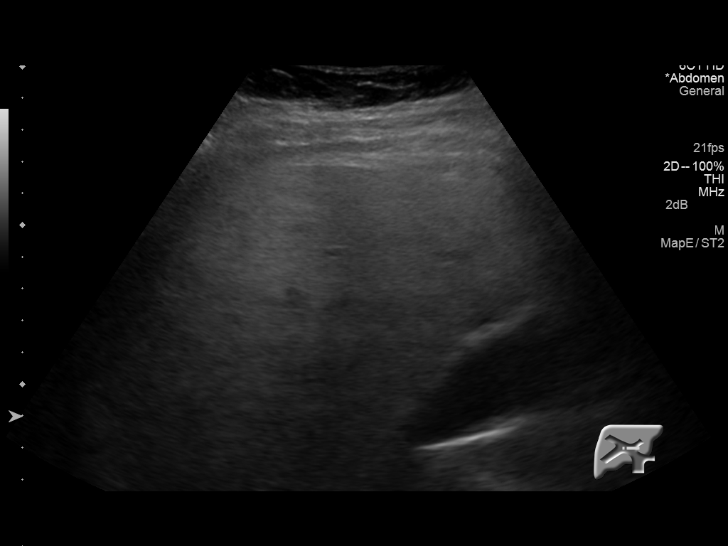
[im 35/104]
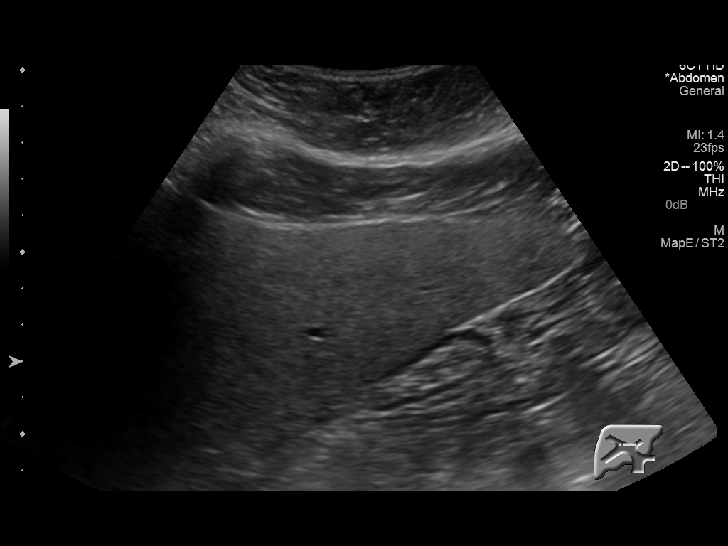
[im 39/104]
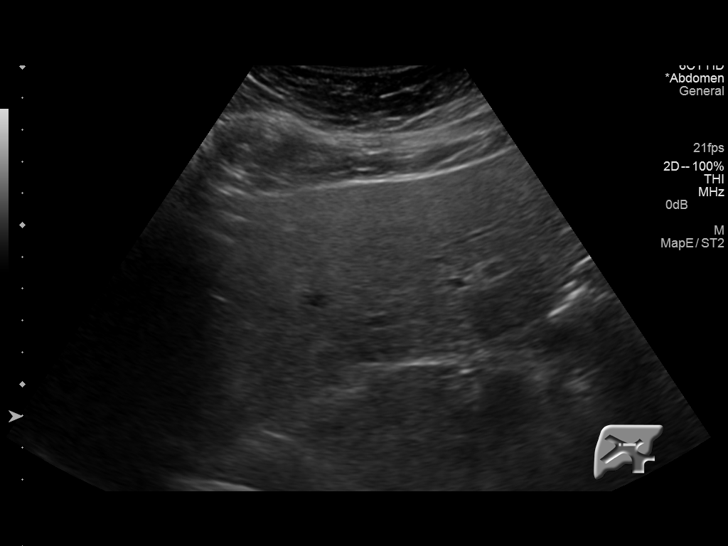
[im 48/104]
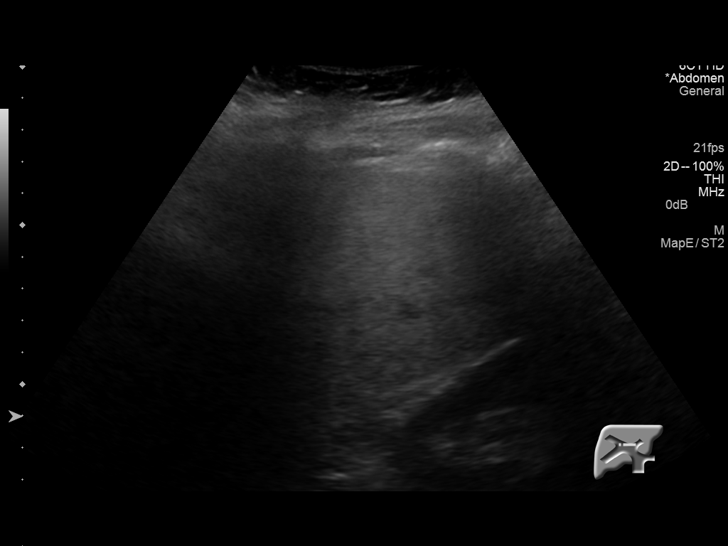
[im 56/104]
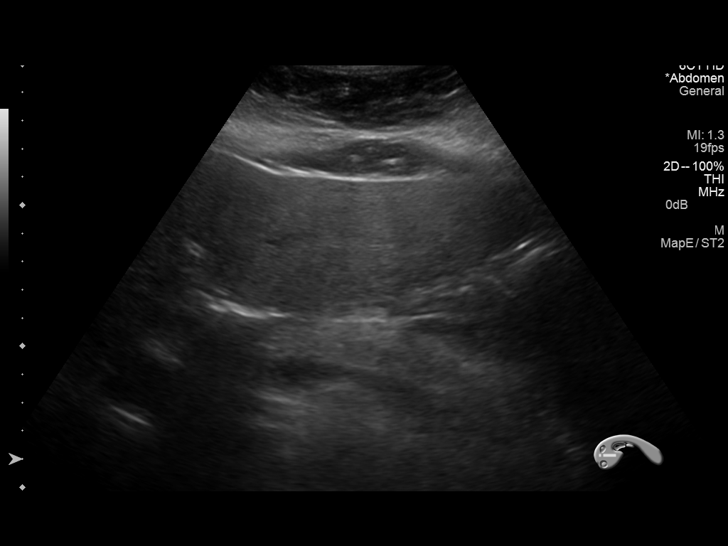
[im 65/104]
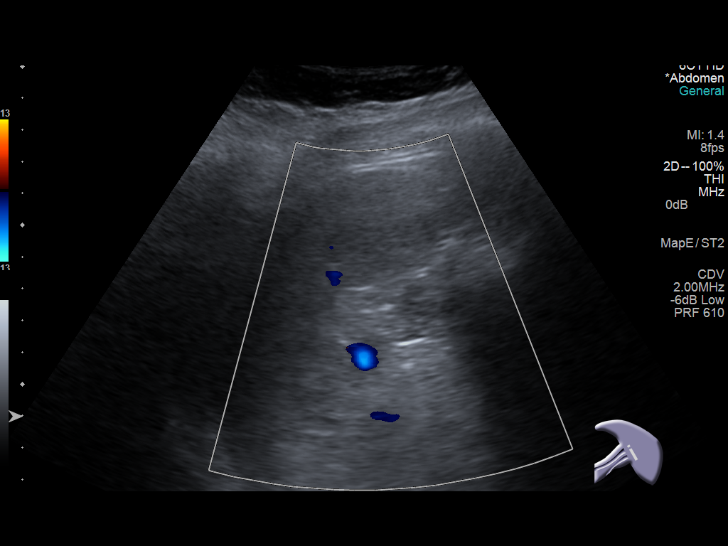
[im 69/104]
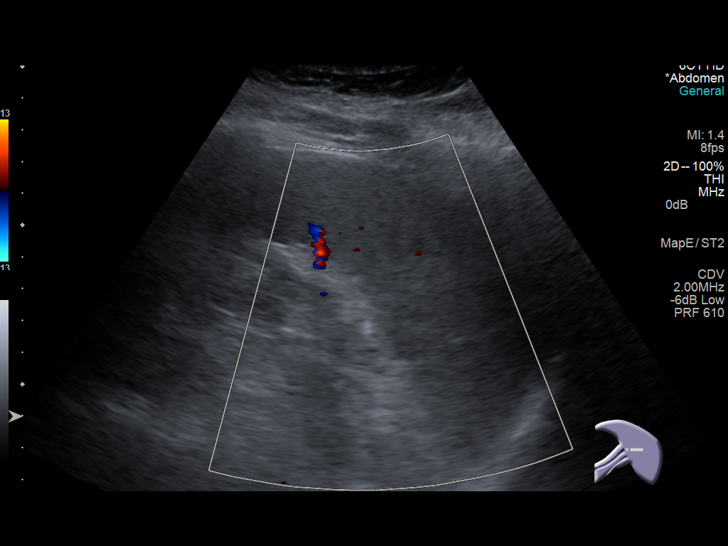
[im 78/104]
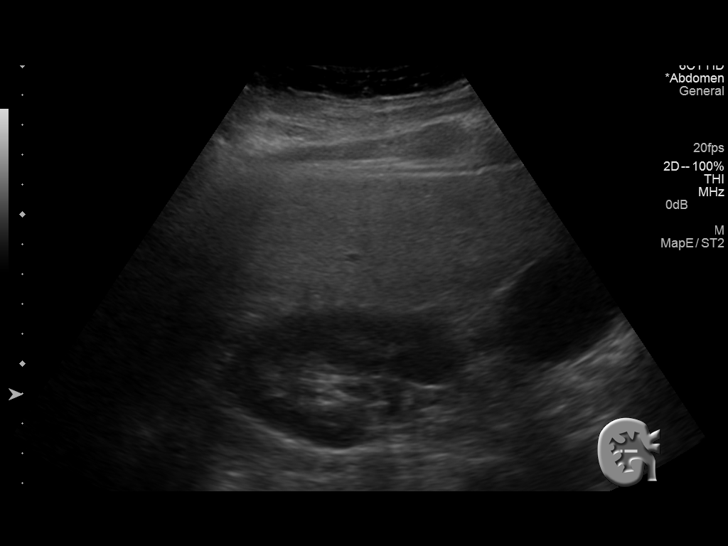
[im 86/104]
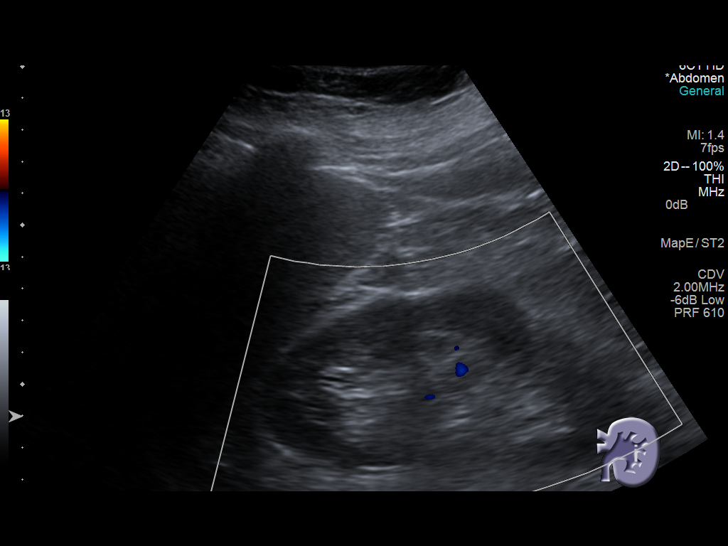
[im 95/104]
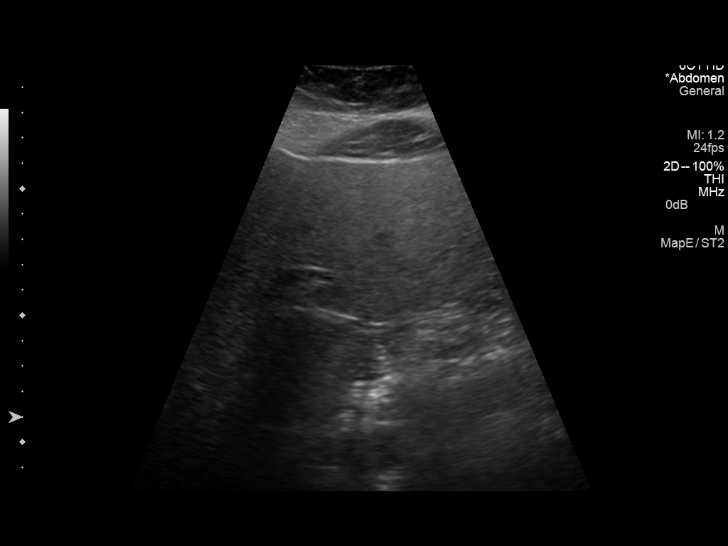
[im 104/104]
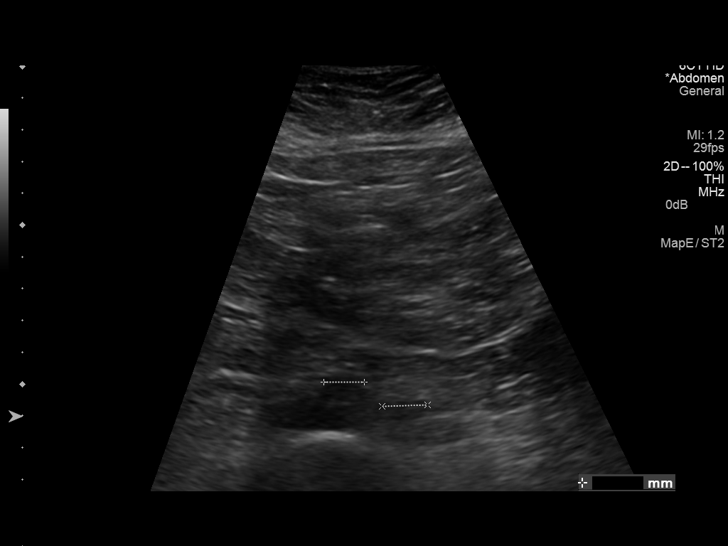

[14 of 25 positions shown; findings below may reference images not displayed]

FINDINGS: Gallbladder: The gallbladder is adequately distended with no
evidence of stones, wall thickening, or pericholecystic fluid. There
is no positive sonographic Murphy's sign.

Common bile duct: Diameter: 4.2 mm

Liver: The hepatic echotexture is mildly increased diffusely. There
is no focal mass or ductal dilation. The surface contour is normal
where visualized.

IVC: No abnormality visualized.

Pancreas: Visualized portion unremarkable.

Spleen: Size and appearance within normal limits.

Right Kidney: Length: 11.7 cm. Echogenicity within normal limits. No
mass or hydronephrosis visualized.

Left Kidney: Length: 10.8 cm. Echogenicity within normal limits. No
mass or hydronephrosis visualized.

Abdominal aorta: No aneurysm visualized. There is mild failure to
taper of the aortic caliber.

Other findings: There is no ascites.
IMPRESSION: 1. Fatty infiltrative change of the liver. No acute gallbladder
pathology is demonstrated.
2. No acute abnormality is demonstrated elsewhere.

## 2017-04-21 ENCOUNTER — Other Ambulatory Visit: Payer: Self-pay | Admitting: Family Medicine

## 2017-04-21 DIAGNOSIS — E119 Type 2 diabetes mellitus without complications: Secondary | ICD-10-CM

## 2017-04-25 ENCOUNTER — Other Ambulatory Visit: Payer: Self-pay | Admitting: Family Medicine

## 2017-04-25 DIAGNOSIS — E119 Type 2 diabetes mellitus without complications: Secondary | ICD-10-CM

## 2017-04-27 ENCOUNTER — Other Ambulatory Visit: Payer: Self-pay | Admitting: Family Medicine

## 2017-04-27 DIAGNOSIS — E119 Type 2 diabetes mellitus without complications: Secondary | ICD-10-CM

## 2017-04-28 ENCOUNTER — Other Ambulatory Visit: Payer: Self-pay | Admitting: Family Medicine

## 2017-04-28 DIAGNOSIS — E119 Type 2 diabetes mellitus without complications: Secondary | ICD-10-CM

## 2018-01-17 ENCOUNTER — Ambulatory Visit (INDEPENDENT_AMBULATORY_CARE_PROVIDER_SITE_OTHER): Payer: BLUE CROSS/BLUE SHIELD | Admitting: Family Medicine

## 2018-01-17 ENCOUNTER — Ambulatory Visit: Payer: PRIVATE HEALTH INSURANCE | Admitting: Family Medicine

## 2018-01-17 ENCOUNTER — Encounter: Payer: Self-pay | Admitting: Family Medicine

## 2018-01-17 VITALS — BP 172/110 | HR 95 | Temp 98.8°F | Ht 67.0 in | Wt 218.8 lb

## 2018-01-17 DIAGNOSIS — R059 Cough, unspecified: Secondary | ICD-10-CM

## 2018-01-17 DIAGNOSIS — R6889 Other general symptoms and signs: Secondary | ICD-10-CM

## 2018-01-17 DIAGNOSIS — R05 Cough: Secondary | ICD-10-CM

## 2018-01-17 LAB — VERITOR FLU A/B WAIVED
Influenza A: NEGATIVE
Influenza B: NEGATIVE

## 2018-01-17 MED ORDER — OSELTAMIVIR PHOSPHATE 75 MG PO CAPS: 75.0000 mg | ORAL_CAPSULE | Freq: Two times a day (BID) | ORAL | 0 refills | Status: DC

## 2018-01-17 MED ORDER — AZITHROMYCIN 250 MG PO TABS: ORAL_TABLET | ORAL | 0 refills | Status: DC

## 2018-01-17 NOTE — Patient Instructions (Signed)
Great to see you!  Your symptoms are most consistent with the flu, be sure to take tylenol 2-3 times daily and drink lots of fluids.

## 2018-01-17 NOTE — Progress Notes (Signed)
   HPI  Patient presents today here for with acute illness.   She reports 2 days of cough congestion, ST, chills, and temp to 100.0  Has 2 + flu contacts at home including her 43 year old son.   Tolerating PO like normal.   PMH: Smoking status noted ROS: Per HPI  Objective: BP (!) 172/110   Pulse 95   Temp 98.8 F (37.1 C) (Oral)   Ht 5\' 7"  (1.702 m)   Wt 218 lb 12.8 oz (99.2 kg)   SpO2 95%   BMI 34.27 kg/m  Gen: NAD, alert, cooperative with exam HEENT: NCAT, oropharynx moist and clear CV: RRR, good S1/S2, no murmur Resp: non labored, coarse sounds in R upper lung field and L lower lung field.  Ext: No edema, warm Neuro: Alert and oriented, No gross deficits  Assessment and plan:  # FLu-like illness, cough FLu negative but likely she does have it given contact,- tamiflu given Coarse sounds on exam, given azithro to cover for possible underlying CAP.      Orders Placed This Encounter  Procedures  . Veritor Flu A/B Waived    Order Specific Question:   Source    Answer:   nasal    Meds ordered this encounter  Medications  . oseltamivir (TAMIFLU) 75 MG capsule    Sig: Take 1 capsule (75 mg total) by mouth 2 (two) times daily.    Dispense:  10 capsule    Refill:  0  . azithromycin (ZITHROMAX) 250 MG tablet    Sig: Take 2 tablets on day 1 and 1 tablet daily after that    Dispense:  6 tablet    Refill:  0    Murtis SinkSam Catherin Doorn, MD [facility] 01/17/2018, 12:29 PM

## 2018-02-07 ENCOUNTER — Ambulatory Visit: Payer: BLUE CROSS/BLUE SHIELD | Admitting: Family Medicine

## 2018-02-10 ENCOUNTER — Ambulatory Visit (INDEPENDENT_AMBULATORY_CARE_PROVIDER_SITE_OTHER): Payer: BLUE CROSS/BLUE SHIELD | Admitting: Family Medicine

## 2018-02-10 ENCOUNTER — Encounter: Payer: Self-pay | Admitting: Family Medicine

## 2018-02-10 DIAGNOSIS — E782 Mixed hyperlipidemia: Secondary | ICD-10-CM

## 2018-02-10 DIAGNOSIS — E119 Type 2 diabetes mellitus without complications: Secondary | ICD-10-CM

## 2018-02-10 DIAGNOSIS — R232 Flushing: Secondary | ICD-10-CM

## 2018-02-10 LAB — BAYER DCA HB A1C WAIVED: HB A1C (BAYER DCA - WAIVED): 6.7 %

## 2018-02-10 MED ORDER — METFORMIN HCL 500 MG PO TABS: 500.0000 mg | ORAL_TABLET | Freq: Two times a day (BID) | ORAL | 2 refills | Status: DC

## 2018-02-10 MED ORDER — BLOOD GLUCOSE MONITOR SYSTEM W/DEVICE KIT: 1.0000 | PACK | Freq: Two times a day (BID) | 0 refills | Status: DC

## 2018-02-10 MED ORDER — PANTOPRAZOLE SODIUM 40 MG PO TBEC: 40.0000 mg | DELAYED_RELEASE_TABLET | Freq: Every day | ORAL | 3 refills | Status: DC

## 2018-02-10 MED ORDER — LANCETS THIN MISC: 12 refills | Status: DC

## 2018-02-10 MED ORDER — GLUCOSE BLOOD VI STRP
ORAL_STRIP | 12 refills | Status: DC
Start: 2018-02-10 — End: 2019-12-04

## 2018-02-10 NOTE — Progress Notes (Signed)
Subjective:  Patient ID: Casey Norman, female    DOB: 1976-09-19  Age: 42 y.o. MRN: [id number]  CC: Diabetes (pt here today for follow up of her diabetes which she hasn't been taking any medications or keeping up with due to family stressors at home)   HPI Casey Norman presents forFollow-up of diabetes. Patient not checking blood sugar at home.  Patient denies symptoms such as polyuria, polydipsia, excessive hunger, nausea No significant hypoglycemic spells noted. Medications reviewed. Pt reports  Not  taking them.  She has been through stress sores with her parents declining health and due to age etc.  Recently she has made arrangements for their care with them and she now has more time available to take care of herself.   History Casey Norman has a past medical history of Diabetes mellitus without complication ([place]).   She has no past surgical history on file.   Her family history includes Cancer in her father and paternal grandfather; Cancer (age of onset: 44) in her mother; Osteopenia in her mother; Stroke in her mother.She reports that she has been smoking.  She has been smoking about 1.00 pack per day. She has never used smokeless tobacco. She reports that she drinks alcohol. She reports that she does not use drugs.  Current Outpatient Medications on File Prior to Visit  Medication Sig Dispense Refill  . esomeprazole (NEXIUM) 40 MG capsule Take 1 capsule (40 mg total) by mouth daily. 30 capsule 3  . traZODone (DESYREL) 150 MG tablet 1 or 2 at bedtime for sleep 90 tablet 3   No current facility-administered medications on file prior to visit.     ROS Review of Systems  Constitutional: Negative.   HENT: Negative for congestion.   Eyes: Negative for visual disturbance.  Respiratory: Negative for shortness of breath.   Cardiovascular: Negative for chest pain.  Gastrointestinal: Negative for abdominal pain, constipation, diarrhea, nausea and vomiting.  Endocrine:       Patient  reports hot flashes.  These are occurring day and night quite frequently and increasing in frequency.  Her periods remain regular.  They had not changed in character regarding length or flow.  Genitourinary: Negative for difficulty urinating.  Musculoskeletal: Negative for arthralgias and myalgias.  Neurological: Negative for headaches.  Psychiatric/Behavioral: Negative for sleep disturbance.    Objective:  There were no vitals taken for this visit.  BP Readings from Last 3 Encounters:  01/17/18 (!) 172/110  08/27/16 (!) 142/95  03/24/16 137/85    Wt Readings from Last 3 Encounters:  01/17/18 218 lb 12.8 oz (99.2 kg)  08/27/16 221 lb 6.4 oz (100.4 kg)  03/24/16 223 lb (101.2 kg)     Physical Exam  Constitutional: She is oriented to person, place, and time. No distress.  HENT:  Head: Normocephalic and atraumatic.  Right Ear: External ear normal.  Left Ear: External ear normal.  Nose: Nose normal.  Mouth/Throat: Oropharynx is clear and moist.  Eyes: Pupils are equal, round, and reactive to light. Conjunctivae and EOM are normal. No scleral icterus.  Neck: Normal range of motion. Neck supple. No JVD present. No tracheal deviation present. No thyromegaly present.  Cardiovascular: Normal rate, regular rhythm and normal heart sounds. Exam reveals no gallop and no friction rub.  No murmur heard. Pulmonary/Chest: Effort normal and breath sounds normal. No respiratory distress. She has no wheezes. She has no rales. She exhibits no tenderness.  Abdominal: Soft. Bowel sounds are normal. She exhibits no distension and no mass. There  is no tenderness. There is no rebound and no guarding.  Musculoskeletal: Normal range of motion. She exhibits no edema or tenderness.  Lymphadenopathy:    She has no cervical adenopathy.  Neurological: She is alert and oriented to person, place, and time. She displays normal reflexes. No cranial nerve deficit. She exhibits normal muscle tone. Coordination  normal.  Skin: Skin is warm and dry. No rash noted. She is not diaphoretic. No erythema.  Psychiatric: Judgment normal.  Vitals reviewed.     Assessment & Plan:   Casey Norman was seen today for diabetes.  Diagnoses and all orders for this visit:  Controlled type 2 diabetes mellitus without complication, without long-term current use of insulin (HCC) -     Bayer DCA Hb A1c Waived -     CBC with Differential/Platelet -     CMP14+EGFR  Mixed hyperlipidemia -     Lipid panel  Hot flashes -     TSH -     FSH/LH  Other orders -     pantoprazole (PROTONIX) 40 MG tablet; Take 1 tablet (40 mg total) by mouth daily. -     metFORMIN (GLUCOPHAGE) 500 MG tablet; Take 1 tablet (500 mg total) by mouth 2 (two) times daily with a meal. -     Blood Glucose Monitoring Suppl (BLOOD GLUCOSE MONITOR SYSTEM) w/Device KIT; 1 Device by Does not apply route 2 (two) times daily. -     Lancets Thin MISC; Use to check blood sugar twice daily -     glucose blood test strip; Use as instructed      I have discontinued Casey Norman's oseltamivir and azithromycin. I am also having her start on pantoprazole, metFORMIN, Blood Glucose Monitor System, Lancets Thin, and glucose blood. Additionally, I am having her maintain her esomeprazole and traZODone.  Meds ordered this encounter  Medications  . pantoprazole (PROTONIX) 40 MG tablet    Sig: Take 1 tablet (40 mg total) by mouth daily.    Dispense:  90 tablet    Refill:  3  . metFORMIN (GLUCOPHAGE) 500 MG tablet    Sig: Take 1 tablet (500 mg total) by mouth 2 (two) times daily with a meal.    Dispense:  60 tablet    Refill:  2  . Blood Glucose Monitoring Suppl (BLOOD GLUCOSE MONITOR SYSTEM) w/Device KIT    Sig: 1 Device by Does not apply route 2 (two) times daily.    Dispense:  1 each    Refill:  0  . Lancets Thin MISC    Sig: Use to check blood sugar twice daily    Dispense:  100 each    Refill:  12  . glucose blood test strip    Sig: Use as  instructed    Dispense:  100 each    Refill:  12   We will investigate her hot flashes from the standpoint of endocrine looking at the possibility of sex hormone and thyroid primarily.  Possibly other causes related to mineralocorticoids or serotonin prolactin etc. as a secondary approach.  Follow-up: Return in about 3 months (around 05/12/2018).  [name], M.D.

## 2018-02-10 NOTE — Patient Instructions (Signed)

## 2018-02-11 LAB — CMP14+EGFR
ALT: 41 IU/L — ABNORMAL HIGH (ref 0–32)
AST: 51 IU/L — ABNORMAL HIGH (ref 0–40)
Albumin/Globulin Ratio: 1.2 (ref 1.2–2.2)
Albumin: 4.1 g/dL (ref 3.5–5.5)
Alkaline Phosphatase: 87 IU/L (ref 39–117)
BUN/Creatinine Ratio: 14 (ref 9–23)
BUN: 9 mg/dL (ref 6–24)
Bilirubin Total: 0.3 mg/dL (ref 0.0–1.2)
CO2: 22 mmol/L (ref 20–29)
Calcium: 9.2 mg/dL (ref 8.7–10.2)
Chloride: 101 mmol/L (ref 96–106)
Creatinine, Ser: 0.65 mg/dL (ref 0.57–1.00)
GFR calc Af Amer: 126 mL/min/{1.73_m2} (ref >59)
GFR calc non Af Amer: 109 mL/min/{1.73_m2} (ref >59)
Globulin, Total: 3.3 g/dL (ref 1.5–4.5)
Glucose: 192 mg/dL — ABNORMAL HIGH (ref 65–99)
Potassium: 4.6 mmol/L (ref 3.5–5.2)
Sodium: 137 mmol/L (ref 134–144)
Total Protein: 7.4 g/dL (ref 6.0–8.5)

## 2018-02-11 LAB — CBC WITH DIFFERENTIAL/PLATELET
Basophils Absolute: 0.1 10*3/uL (ref 0.0–0.2)
Basos: 1 %
EOS (ABSOLUTE): 0.2 10*3/uL (ref 0.0–0.4)
Eos: 4 %
Hematocrit: 43.2 % (ref 34.0–46.6)
Hemoglobin: 15.2 g/dL (ref 11.1–15.9)
Immature Grans (Abs): 0 10*3/uL (ref 0.0–0.1)
Immature Granulocytes: 0 %
Lymphocytes Absolute: 1.9 10*3/uL (ref 0.7–3.1)
Lymphs: 33 %
MCH: 35.3 pg — ABNORMAL HIGH (ref 26.6–33.0)
MCHC: 35.2 g/dL (ref 31.5–35.7)
MCV: 101 fL — ABNORMAL HIGH (ref 79–97)
Monocytes Absolute: 0.5 10*3/uL (ref 0.1–0.9)
Monocytes: 9 %
Neutrophils Absolute: 2.9 10*3/uL (ref 1.4–7.0)
Neutrophils: 53 %
Platelets: 180 10*3/uL (ref 150–379)
RBC: 4.3 x10E6/uL (ref 3.77–5.28)
RDW: 12.9 % (ref 12.3–15.4)
WBC: 5.6 10*3/uL (ref 3.4–10.8)

## 2018-02-11 LAB — LIPID PANEL
Chol/HDL Ratio: 5.7 ratio — ABNORMAL HIGH (ref 0.0–4.4)
Cholesterol, Total: 210 mg/dL — ABNORMAL HIGH (ref 100–199)
HDL: 37 mg/dL — ABNORMAL LOW (ref >39)
LDL Calculated: 132 mg/dL — ABNORMAL HIGH (ref 0–99)
Triglycerides: 203 mg/dL — ABNORMAL HIGH (ref 0–149)
VLDL Cholesterol Cal: 41 mg/dL — ABNORMAL HIGH (ref 5–40)

## 2018-02-11 LAB — FSH/LH
FSH: 5.7 m[IU]/mL
LH: 10.5 m[IU]/mL

## 2018-02-11 LAB — TSH: TSH: 1.37 u[IU]/mL (ref 0.450–4.500)

## 2018-02-12 ENCOUNTER — Other Ambulatory Visit: Payer: Self-pay | Admitting: Family Medicine

## 2018-02-12 ENCOUNTER — Encounter: Payer: Self-pay | Admitting: Family Medicine

## 2018-02-12 DIAGNOSIS — R232 Flushing: Secondary | ICD-10-CM

## 2018-02-15 ENCOUNTER — Other Ambulatory Visit: Payer: BLUE CROSS/BLUE SHIELD

## 2018-02-15 DIAGNOSIS — R232 Flushing: Secondary | ICD-10-CM

## 2018-02-17 LAB — SEROTONIN SERUM: Serotonin, Serum: 56 ng/mL (ref 0–420)

## 2018-02-21 ENCOUNTER — Encounter: Payer: Self-pay | Admitting: Family Medicine

## 2018-02-21 ENCOUNTER — Ambulatory Visit (INDEPENDENT_AMBULATORY_CARE_PROVIDER_SITE_OTHER): Payer: BLUE CROSS/BLUE SHIELD | Admitting: Family Medicine

## 2018-02-21 VITALS — BP 174/93 | HR 117 | Temp 99.8°F | Ht 67.0 in | Wt 217.1 lb

## 2018-02-21 DIAGNOSIS — J329 Chronic sinusitis, unspecified: Secondary | ICD-10-CM

## 2018-02-21 DIAGNOSIS — J40 Bronchitis, not specified as acute or chronic: Secondary | ICD-10-CM | POA: Diagnosis not present

## 2018-02-21 MED ORDER — BENZONATATE 200 MG PO CAPS: 200.0000 mg | ORAL_CAPSULE | Freq: Three times a day (TID) | ORAL | 0 refills | Status: DC | PRN

## 2018-02-21 MED ORDER — AZITHROMYCIN 250 MG PO TABS: ORAL_TABLET | ORAL | 0 refills | Status: DC

## 2018-02-21 NOTE — Progress Notes (Signed)
Chief Complaint  Patient presents with  . Cough    pt here today c/o cough     HPI  Patient presents today for Patient presents with upper respiratory congestion.  She is not having rhinorrhea .There is minimal sore throat. Patient reports coughing frequently as well.  Clear sputum noted. There is no fever, chills or sweats. The patient reports being short of breath. Onset was 2 days ago. Gradually worsening. Tried OTCs without improvement.  There is associated chest tightness  PMH: Smoking status noted ROS: Per HPI  Objective: BP (!) 174/93   Pulse (!) 117   Temp 99.8 F (37.7 C) (Oral)   Ht 5\' 7"  (1.702 m)   Wt 217 lb 2 oz (98.5 kg)   BMI 34.01 kg/m  Gen: NAD, alert, cooperative with exam HEENT: NCAT, Nasal passages boggy TMs clear CV: RRR, good S1/S2, no murmur Resp: Bronchitis changes with scattered wheezes, non-labored Ext: No edema, warm Neuro: Alert and oriented, No gross deficits  Assessment and plan:  1. Sinobronchitis     Meds ordered this encounter  Medications  . azithromycin (ZITHROMAX) 250 MG tablet    Sig: Take as directed    Dispense:  6 tablet    Refill:  0  . benzonatate (TESSALON) 200 MG capsule    Sig: Take 1 capsule (200 mg total) by mouth 3 (three) times daily as needed for cough.    Dispense:  20 capsule    Refill:  0    No orders of the defined types were placed in this encounter.   Follow up as needed.  Mechele ClaudeWarren Krystalynn Ridgeway, MD

## 2018-03-31 ENCOUNTER — Ambulatory Visit: Payer: BLUE CROSS/BLUE SHIELD | Admitting: Family Medicine

## 2018-04-13 ENCOUNTER — Encounter: Payer: Self-pay | Admitting: Family Medicine

## 2018-04-13 ENCOUNTER — Ambulatory Visit (INDEPENDENT_AMBULATORY_CARE_PROVIDER_SITE_OTHER): Payer: BLUE CROSS/BLUE SHIELD | Admitting: Family Medicine

## 2018-04-13 VITALS — BP 153/89 | HR 90 | Temp 98.0°F | Ht 67.0 in | Wt 216.0 lb

## 2018-04-13 DIAGNOSIS — E119 Type 2 diabetes mellitus without complications: Secondary | ICD-10-CM

## 2018-04-13 DIAGNOSIS — F5101 Primary insomnia: Secondary | ICD-10-CM

## 2018-04-13 DIAGNOSIS — R740 Nonspecific elevation of levels of transaminase and lactic acid dehydrogenase [LDH]: Secondary | ICD-10-CM | POA: Diagnosis not present

## 2018-04-13 DIAGNOSIS — R232 Flushing: Secondary | ICD-10-CM

## 2018-04-13 DIAGNOSIS — R7401 Elevation of levels of liver transaminase levels: Secondary | ICD-10-CM

## 2018-04-13 LAB — URINALYSIS
Bilirubin, UA: NEGATIVE
Glucose, UA: NEGATIVE
Leukocytes, UA: NEGATIVE
Nitrite, UA: NEGATIVE
Specific Gravity, UA: 1.025 (ref 1.005–1.030)
Urobilinogen, Ur: 1 mg/dL (ref 0.2–1.0)
pH, UA: 5.5 (ref 5.0–7.5)

## 2018-04-13 LAB — BAYER DCA HB A1C WAIVED: HB A1C (BAYER DCA - WAIVED): 6.6 % (ref <7.0)

## 2018-04-13 MED ORDER — METFORMIN HCL 500 MG PO TABS: 500.0000 mg | ORAL_TABLET | Freq: Two times a day (BID) | ORAL | 2 refills | Status: DC

## 2018-04-13 MED ORDER — TRAZODONE HCL 150 MG PO TABS: ORAL_TABLET | ORAL | 3 refills | Status: DC

## 2018-04-13 NOTE — Progress Notes (Signed)
Subjective:  Patient ID: Casey Norman, female    DOB: [birth year]  Age: 42 y.o. MRN: [id number]  CC: Diabetes   HPI Carlen Rebuck presents forFollow-up of diabetes. Patient checks blood sugar at home.  Readings have been125- 160 fasting and 190-200 postprandial. however, she has been distracted from her usual routine by the birth of her grand baby. the baby is a preemie born at 78 weeks 5 days on April 04, 2019.  The baby's name is [facility]. Patient denies symptoms such as polyuria, polydipsia, excessive hunger, nausea No significant hypoglycemic spells noted. Medications reviewed. Pt reports taking them regularly without complication/adverse reaction being reported today.     History Kadi has a past medical history of Diabetes mellitus without complication ([place]).   She has no past surgical history on file.   Her family history includes Cancer in her father and paternal grandfather; Cancer (age of onset: 68) in her mother; Osteopenia in her mother; Stroke in her mother.She reports that she has been smoking.  She has been smoking about 1.00 pack per day. She has never used smokeless tobacco. She reports that she drinks alcohol. She reports that she does not use drugs.  Current Outpatient Medications on File Prior to Visit  Medication Sig Dispense Refill  . Blood Glucose Monitoring Suppl (BLOOD GLUCOSE MONITOR SYSTEM) w/Device KIT 1 Device by Does not apply route 2 (two) times daily. 1 each 0  . glucose blood test strip Use as instructed 100 each 12  . Lancets Thin MISC Use to check blood sugar twice daily 100 each 12  . pantoprazole (PROTONIX) 40 MG tablet Take 1 tablet (40 mg total) by mouth daily. 90 tablet 3   No current facility-administered medications on file prior to visit.     ROS Review of Systems  Constitutional: Negative for fever.  HENT: Negative for congestion, rhinorrhea and sore throat.   Respiratory: Negative for cough and shortness of breath.     Cardiovascular: Negative for chest pain and palpitations.  Gastrointestinal: Negative for abdominal pain.  Genitourinary: Positive for menstrual problem (Hot flashes).  Musculoskeletal: Negative for arthralgias and myalgias.  Psychiatric/Behavioral: Positive for sleep disturbance (Recurrent insomnia).    Objective:  BP (!) 153/89   Pulse 90   Temp 98 F (36.7 C) (Oral)   Ht 5' 7"  (1.702 m)   Wt 216 lb (98 kg)   BMI 33.83 kg/m   BP Readings from Last 3 Encounters:  04/13/18 (!) 153/89  02/21/18 (!) 174/93  01/17/18 (!) 172/110    Wt Readings from Last 3 Encounters:  04/13/18 216 lb (98 kg)  02/21/18 217 lb 2 oz (98.5 kg)  01/17/18 218 lb 12.8 oz (99.2 kg)     Physical Exam  Constitutional: She is oriented to person, place, and time. She appears well-developed and well-nourished. No distress.  HENT:  Head: Normocephalic and atraumatic.  Right Ear: External ear normal.  Left Ear: External ear normal.  Nose: Nose normal.  Mouth/Throat: Oropharynx is clear and moist.  Eyes: Pupils are equal, round, and reactive to light. Conjunctivae and EOM are normal.  Neck: Normal range of motion. Neck supple. No thyromegaly present.  Cardiovascular: Normal rate, regular rhythm and normal heart sounds.  No murmur heard. Pulmonary/Chest: Effort normal and breath sounds normal. No respiratory distress. She has no wheezes. She has no rales.  Abdominal: Soft. Bowel sounds are normal. She exhibits no distension. There is no tenderness.  Lymphadenopathy:    She has no cervical adenopathy.  Neurological: She is alert and oriented to person, place, and time. She has normal reflexes.  Skin: Skin is warm and dry.  Psychiatric: She has a normal mood and affect. Her behavior is normal. Judgment and thought content normal.      Assessment & Plan:   Lynlee was seen today for diabetes.  Diagnoses and all orders for this visit:  Controlled type 2 diabetes mellitus without complication,  without long-term current use of insulin (HCC) -     CMP14+EGFR -     Bayer DCA Hb A1c Waived -     Urinalysis -     metFORMIN (GLUCOPHAGE) 500 MG tablet; Take 1 tablet (500 mg total) by mouth 2 (two) times daily with a meal.  Hot flashes -     Follicle stimulating hormone  Primary insomnia -     traZODone (DESYREL) 150 MG tablet; 1 or 2 at bedtime for sleep  Transaminitis -     Hepatitis panel, acute -     Specimen status report      I have discontinued Fritzie Alatorre's esomeprazole, azithromycin, and benzonatate. I am also having her maintain her pantoprazole, Blood Glucose Monitor System, Lancets Thin, glucose blood, traZODone, and metFORMIN.  Meds ordered this encounter  Medications  . traZODone (DESYREL) 150 MG tablet    Sig: 1 or 2 at bedtime for sleep    Dispense:  90 tablet    Refill:  3  . metFORMIN (GLUCOPHAGE) 500 MG tablet    Sig: Take 1 tablet (500 mg total) by mouth 2 (two) times daily with a meal.    Dispense:  180 tablet    Refill:  2     Follow-up: Return in about 3 months (around 07/14/2018).  [name], M.D.

## 2018-04-13 NOTE — Patient Instructions (Signed)

## 2018-04-14 ENCOUNTER — Other Ambulatory Visit: Payer: Self-pay | Admitting: *Deleted

## 2018-04-14 DIAGNOSIS — R945 Abnormal results of liver function studies: Principal | ICD-10-CM

## 2018-04-14 DIAGNOSIS — R7989 Other specified abnormal findings of blood chemistry: Secondary | ICD-10-CM

## 2018-04-14 LAB — CMP14+EGFR
ALT: 73 IU/L — ABNORMAL HIGH (ref 0–32)
AST: 101 IU/L — ABNORMAL HIGH (ref 0–40)
Albumin/Globulin Ratio: 1.3 (ref 1.2–2.2)
Albumin: 4 g/dL (ref 3.5–5.5)
Alkaline Phosphatase: 85 IU/L (ref 39–117)
BUN/Creatinine Ratio: 9 (ref 9–23)
BUN: 6 mg/dL (ref 6–24)
Bilirubin Total: 0.4 mg/dL (ref 0.0–1.2)
CO2: 23 mmol/L (ref 20–29)
Calcium: 9.6 mg/dL (ref 8.7–10.2)
Chloride: 102 mmol/L (ref 96–106)
Creatinine, Ser: 0.66 mg/dL (ref 0.57–1.00)
GFR calc Af Amer: 125 mL/min/{1.73_m2} (ref >59)
GFR calc non Af Amer: 109 mL/min/{1.73_m2} (ref >59)
Globulin, Total: 3.2 g/dL (ref 1.5–4.5)
Glucose: 151 mg/dL — ABNORMAL HIGH (ref 65–99)
Potassium: 4 mmol/L (ref 3.5–5.2)
Sodium: 141 mmol/L (ref 134–144)
Total Protein: 7.2 g/dL (ref 6.0–8.5)

## 2018-04-14 LAB — FOLLICLE STIMULATING HORMONE: FSH: 7.8 m[IU]/mL

## 2018-04-15 LAB — HEPATITIS PANEL, ACUTE
Hep A IgM: NEGATIVE
Hep B C IgM: NEGATIVE
Hep C Virus Ab: <0.1 s/co ratio (ref 0.0–0.9)
Hepatitis B Surface Ag: NEGATIVE

## 2018-04-15 LAB — SPECIMEN STATUS REPORT

## 2018-04-17 ENCOUNTER — Encounter: Payer: Self-pay | Admitting: Family Medicine

## 2018-07-14 ENCOUNTER — Ambulatory Visit: Payer: BLUE CROSS/BLUE SHIELD | Admitting: Family Medicine

## 2018-07-14 ENCOUNTER — Encounter: Payer: Self-pay | Admitting: Family Medicine

## 2018-07-14 VITALS — BP 164/93 | HR 94 | Temp 98.8°F | Ht 67.0 in | Wt 212.2 lb

## 2018-07-14 DIAGNOSIS — E119 Type 2 diabetes mellitus without complications: Secondary | ICD-10-CM | POA: Diagnosis not present

## 2018-07-14 DIAGNOSIS — E782 Mixed hyperlipidemia: Secondary | ICD-10-CM | POA: Diagnosis not present

## 2018-07-14 LAB — CMP14+EGFR
ALT: 91 IU/L — ABNORMAL HIGH (ref 0–32)
AST: 181 IU/L — ABNORMAL HIGH (ref 0–40)
Albumin/Globulin Ratio: 1.3 (ref 1.2–2.2)
Albumin: 4.3 g/dL (ref 3.5–5.5)
Alkaline Phosphatase: 85 IU/L (ref 39–117)
BUN/Creatinine Ratio: 9 (ref 9–23)
BUN: 6 mg/dL (ref 6–24)
Bilirubin Total: 0.4 mg/dL (ref 0.0–1.2)
CO2: 22 mmol/L (ref 20–29)
Calcium: 9.4 mg/dL (ref 8.7–10.2)
Chloride: 102 mmol/L (ref 96–106)
Creatinine, Ser: 0.7 mg/dL (ref 0.57–1.00)
GFR calc Af Amer: 123 mL/min/{1.73_m2} (ref >59)
GFR calc non Af Amer: 106 mL/min/{1.73_m2} (ref >59)
Globulin, Total: 3.2 g/dL (ref 1.5–4.5)
Glucose: 129 mg/dL — ABNORMAL HIGH (ref 65–99)
Potassium: 4.5 mmol/L (ref 3.5–5.2)
Sodium: 139 mmol/L (ref 134–144)
Total Protein: 7.5 g/dL (ref 6.0–8.5)

## 2018-07-14 LAB — URINALYSIS
Bilirubin, UA: NEGATIVE
Glucose, UA: NEGATIVE
Leukocytes, UA: NEGATIVE
Nitrite, UA: NEGATIVE
RBC, UA: NEGATIVE
Specific Gravity, UA: >1.03 — ABNORMAL HIGH (ref 1.005–1.030)
Urobilinogen, Ur: 0.2 mg/dL (ref 0.2–1.0)
pH, UA: 5.5 (ref 5.0–7.5)

## 2018-07-14 LAB — LIPID PANEL
Chol/HDL Ratio: 5.3 ratio — ABNORMAL HIGH (ref 0.0–4.4)
Cholesterol, Total: 221 mg/dL — ABNORMAL HIGH (ref 100–199)
HDL: 42 mg/dL (ref >39)
LDL Calculated: 125 mg/dL — ABNORMAL HIGH (ref 0–99)
Triglycerides: 269 mg/dL — ABNORMAL HIGH (ref 0–149)
VLDL Cholesterol Cal: 54 mg/dL — ABNORMAL HIGH (ref 5–40)

## 2018-07-14 LAB — BAYER DCA HB A1C WAIVED: HB A1C (BAYER DCA - WAIVED): 6.3 % (ref <7.0)

## 2018-07-14 MED ORDER — VALSARTAN 160 MG PO TABS: 160.0000 mg | ORAL_TABLET | Freq: Every day | ORAL | 2 refills | Status: DC

## 2018-07-14 NOTE — Progress Notes (Signed)
Subjective:  Patient ID: Casey Norman,  female    DOB: 03-23-1976  Age: 42 y.o.    CC: Medical Management of Chronic Issues   HPI Shatarra Wehling presents for  follow-upof elevated cholesterol. Doing well without complaints on current medication. Denies side effects  including myalgia and arthralgia and nausea. Also in today for liver function testing. Currently no chest pain, shortness of breath or other cardiovascular related symptoms noted.  Follow-up of diabetes. Patient does check blood sugar at home. Readings run between 100 and 150 Patient denies symptoms such as excessive hunger or urinary frequency, excessive hunger, nausea No significant hypoglycemic spells noted. Medications reviewed. Pt reports taking them regularly. Pt. denies complication/adverse reaction today.   Patient has not been treated in the past for high blood pressure but she notes that is been high lately on checks at home, pharmacy and other doctors visits.   History Mykelti has a past medical history of Diabetes mellitus without complication ([place]).   She has no past surgical history on file.   Her family history includes Cancer in her father and paternal grandfather; Cancer (age of onset: 4) in her mother; Osteopenia in her mother; Stroke in her mother.She reports that she has been smoking. She has been smoking about 1.00 pack per day. She has never used smokeless tobacco. She reports that she drinks alcohol. She reports that she does not use drugs.  Current Outpatient Medications on File Prior to Visit  Medication Sig Dispense Refill  . Blood Glucose Monitoring Suppl (BLOOD GLUCOSE MONITOR SYSTEM) w/Device KIT 1 Device by Does not apply route 2 (two) times daily. 1 each 0  . glucose blood test strip Use as instructed 100 each 12  . Lancets Thin MISC Use to check blood sugar twice daily 100 each 12  . metFORMIN (GLUCOPHAGE) 500 MG tablet Take 1 tablet (500 mg total) by mouth 2 (two) times daily with a  meal. 180 tablet 2  . pantoprazole (PROTONIX) 40 MG tablet Take 1 tablet (40 mg total) by mouth daily. 90 tablet 3  . traZODone (DESYREL) 150 MG tablet 1 or 2 at bedtime for sleep 90 tablet 3   No current facility-administered medications on file prior to visit.     ROS Review of Systems  Constitutional: Negative.   HENT: Negative for congestion.   Eyes: Negative for visual disturbance.  Respiratory: Negative for shortness of breath.   Cardiovascular: Negative for chest pain.  Gastrointestinal: Negative for abdominal pain, constipation, diarrhea, nausea and vomiting.  Genitourinary: Negative for difficulty urinating.  Musculoskeletal: Negative for arthralgias and myalgias.  Neurological: Negative for headaches.  Psychiatric/Behavioral: Negative for sleep disturbance.    Objective:  BP (!) 164/93   Pulse 94   Temp 98.8 F (37.1 C) (Oral)   Ht 5' 7"  (1.702 m)   Wt 212 lb 4 oz (96.3 kg)   BMI 33.24 kg/m   BP Readings from Last 3 Encounters:  07/14/18 (!) 164/93  04/13/18 (!) 153/89  02/21/18 (!) 174/93    Wt Readings from Last 3 Encounters:  07/14/18 212 lb 4 oz (96.3 kg)  04/13/18 216 lb (98 kg)  02/21/18 217 lb 2 oz (98.5 kg)     Physical Exam  Constitutional: She is oriented to person, place, and time. She appears well-developed and well-nourished. No distress.  HENT:  Head: Normocephalic and atraumatic.  Right Ear: External ear normal.  Left Ear: External ear normal.  Nose: Nose normal.  Mouth/Throat: Oropharynx is clear and moist.  Eyes: Pupils are equal, round, and reactive to light. Conjunctivae and EOM are normal.  Neck: Normal range of motion. Neck supple. No thyromegaly present.  Cardiovascular: Normal rate, regular rhythm and normal heart sounds.  No murmur heard. Pulmonary/Chest: Effort normal and breath sounds normal. No respiratory distress. She has no wheezes. She has no rales.  Abdominal: Soft. Bowel sounds are normal. She exhibits no distension.  There is no tenderness.  Lymphadenopathy:    She has no cervical adenopathy.  Neurological: She is alert and oriented to person, place, and time. She has normal reflexes.  Skin: Skin is warm and dry.  Psychiatric: She has a normal mood and affect. Her behavior is normal. Judgment and thought content normal.    Diabetic Foot Exam - Simple   Simple Foot Form Diabetic Foot exam was performed with the following findings:  Yes 07/14/2018 10:07 AM  Visual Inspection Sensation Testing Pulse Check Comments     Results for orders placed or performed in visit on 07/14/18  Bayer DCA Hb A1c Waived  Result Value Ref Range   HB A1C (BAYER DCA - WAIVED) 6.3 <7.0 %  Urinalysis  Result Value Ref Range   Specific Gravity, UA >1.030 (H) 1.005 - 1.030   pH, UA 5.5 5.0 - 7.5   Color, UA Yellow Yellow   Appearance Ur Clear Clear   Leukocytes, UA Negative Negative   Protein, UA 2+ (A) Negative/Trace   Glucose, UA Negative Negative   Ketones, UA Trace (A) Negative   RBC, UA Negative Negative   Bilirubin, UA Negative Negative   Urobilinogen, Ur 0.2 0.2 - 1.0 mg/dL   Nitrite, UA Negative Negative     Assessment & Plan:   Clea was seen today for medical management of chronic issues.  Diagnoses and all orders for this visit:  Controlled type 2 diabetes mellitus without complication, without long-term current use of insulin (HCC) -     CMP14+EGFR -     Microalbumin / creatinine urine ratio -     Bayer DCA Hb A1c Waived -     Urinalysis  Mixed hyperlipidemia -     CMP14+EGFR -     Lipid panel  Other orders -     valsartan (DIOVAN) 160 MG tablet; Take 1 tablet (160 mg total) by mouth daily. For blood pressure and kidney protection   I am having Derinda Late start on valsartan. I am also having her maintain her pantoprazole, Blood Glucose Monitor System, Lancets Thin, glucose blood, traZODone, and metFORMIN.  Meds ordered this encounter  Medications  . valsartan (DIOVAN) 160 MG  tablet    Sig: Take 1 tablet (160 mg total) by mouth daily. For blood pressure and kidney protection    Dispense:  30 tablet    Refill:  2     Follow-up: Return in about 3 months (around 10/13/2018).  [name], M.D.

## 2018-07-15 LAB — MICROALBUMIN / CREATININE URINE RATIO
Creatinine, Urine: 157.2 mg/dL
Microalb/Creat Ratio: 149.2 mg/g{creat} — ABNORMAL HIGH (ref 0.0–30.0)
Microalbumin, Urine: 234.6 ug/mL

## 2018-10-13 ENCOUNTER — Ambulatory Visit: Payer: BLUE CROSS/BLUE SHIELD | Admitting: Family Medicine

## 2018-10-27 ENCOUNTER — Other Ambulatory Visit: Payer: Self-pay | Admitting: Family Medicine

## 2018-11-22 ENCOUNTER — Encounter: Payer: Self-pay | Admitting: Family Medicine

## 2018-11-22 ENCOUNTER — Ambulatory Visit: Payer: BLUE CROSS/BLUE SHIELD | Admitting: Family Medicine

## 2018-11-22 VITALS — BP 123/67 | HR 80 | Temp 97.2°F | Ht 67.0 in | Wt 214.4 lb

## 2018-11-22 DIAGNOSIS — E756 Lipid storage disorder, unspecified: Secondary | ICD-10-CM

## 2018-11-22 DIAGNOSIS — R740 Nonspecific elevation of levels of transaminase and lactic acid dehydrogenase [LDH]: Secondary | ICD-10-CM

## 2018-11-22 DIAGNOSIS — E1169 Type 2 diabetes mellitus with other specified complication: Secondary | ICD-10-CM

## 2018-11-22 DIAGNOSIS — E782 Mixed hyperlipidemia: Secondary | ICD-10-CM

## 2018-11-22 DIAGNOSIS — I10 Essential (primary) hypertension: Secondary | ICD-10-CM

## 2018-11-22 DIAGNOSIS — E119 Type 2 diabetes mellitus without complications: Secondary | ICD-10-CM

## 2018-11-22 DIAGNOSIS — E1159 Type 2 diabetes mellitus with other circulatory complications: Secondary | ICD-10-CM | POA: Insufficient documentation

## 2018-11-22 DIAGNOSIS — I152 Hypertension secondary to endocrine disorders: Secondary | ICD-10-CM | POA: Insufficient documentation

## 2018-11-22 DIAGNOSIS — IMO0001 Reserved for inherently not codable concepts without codable children: Secondary | ICD-10-CM

## 2018-11-22 DIAGNOSIS — F1020 Alcohol dependence, uncomplicated: Secondary | ICD-10-CM

## 2018-11-22 DIAGNOSIS — R7401 Elevation of levels of liver transaminase levels: Secondary | ICD-10-CM

## 2018-11-22 LAB — BAYER DCA HB A1C WAIVED: HB A1C (BAYER DCA - WAIVED): 6.6 % (ref <7.0)

## 2018-11-22 MED ORDER — METFORMIN HCL 500 MG PO TABS: 500.0000 mg | ORAL_TABLET | Freq: Two times a day (BID) | ORAL | 1 refills | Status: DC

## 2018-11-22 MED ORDER — ESZOPICLONE 2 MG PO TABS: 2.0000 mg | ORAL_TABLET | Freq: Every evening | ORAL | 2 refills | Status: DC | PRN

## 2018-11-22 MED ORDER — VALSARTAN 160 MG PO TABS: ORAL_TABLET | ORAL | 1 refills | Status: DC

## 2018-11-22 NOTE — Progress Notes (Signed)
 Subjective:  Patient ID: Casey Norman,  female    DOB: 06/29/1976  Age: 43 y.o.    CC: Medical Management of Chronic Issues (pt here today for routine follow up of her chronic medical conditions and also c/o trazodone not helping )   HPI Aavya Corporan presents for  follow-up of hypertension. Patient has no history of headache chest pain or shortness of breath or recent cough. Patient also denies symptoms of TIA such as numbness weakness lateralizing. Patient denies side effects from medication. States taking it regularly.  Patient also  in for follow-up of elevated cholesterol. Doing well without complaints on current medication. Denies side effects  including myalgia and arthralgia and nausea. Also in today for liver function testing. Currently no chest pain, shortness of breath or other cardiovascular related symptoms noted.  Follow-up of diabetes. Patient does not check blood sugar at home.Patient denies symptoms such as excessive hunger or urinary frequency, excessive hunger, nausea No significant hypoglycemic spells noted. Medications reviewed. Pt reports taking them regularly. Pt. denies complication/adverse reaction today.   Pt. Not sleeping in spite of multiple trials of medication. She has weaned herself off of alcohol. Had been drinking 8-9 beers a night.    History Scharlene has a past medical history of Diabetes mellitus without complication (HCC).   She has no past surgical history on file.   Her family history includes Cancer in her father and paternal grandfather; Cancer (age of onset: 28) in her mother; Osteopenia in her mother; Stroke in her mother.She reports that she has been smoking. She has been smoking about 1.00 pack per day. She has never used smokeless tobacco. She reports current alcohol use. She reports that she does not use drugs.  Current Outpatient Medications on File Prior to Visit  Medication Sig Dispense Refill  . Blood Glucose Monitoring Suppl (BLOOD  GLUCOSE MONITOR SYSTEM) w/Device KIT 1 Device by Does not apply route 2 (two) times daily. 1 each 0  . glucose blood test strip Use as instructed 100 each 12  . Lancets Thin MISC Use to check blood sugar twice daily 100 each 12  . traZODone (DESYREL) 150 MG tablet 1 or 2 at bedtime for sleep 90 tablet 3   No current facility-administered medications on file prior to visit.     ROS Review of Systems  Constitutional: Negative.   HENT: Negative for congestion.   Eyes: Negative for visual disturbance.  Respiratory: Negative for shortness of breath.   Cardiovascular: Negative for chest pain.  Gastrointestinal: Negative for abdominal pain, constipation, diarrhea, nausea and vomiting.  Genitourinary: Negative for difficulty urinating.  Musculoskeletal: Negative for arthralgias and myalgias.  Neurological: Negative for headaches.  Psychiatric/Behavioral: Positive for sleep disturbance.    Objective:  BP 123/67   Pulse 80   Temp (!) 97.2 F (36.2 C) (Oral)   Ht 5' 7" (1.702 m)   Wt 214 lb 6 oz (97.2 kg)   BMI 33.58 kg/m   BP Readings from Last 3 Encounters:  11/22/18 123/67  07/14/18 (!) 164/93  04/13/18 (!) 153/89    Wt Readings from Last 3 Encounters:  11/22/18 214 lb 6 oz (97.2 kg)  07/14/18 212 lb 4 oz (96.3 kg)  04/13/18 216 lb (98 kg)     Physical Exam Constitutional:      General: She is not in acute distress.    Appearance: She is well-developed.  HENT:     Head: Normocephalic and atraumatic.     Right Ear: External ear   normal.     Left Ear: External ear normal.     Nose: Nose normal.  Eyes:     Conjunctiva/sclera: Conjunctivae normal.     Pupils: Pupils are equal, round, and reactive to light.  Neck:     Musculoskeletal: Normal range of motion and neck supple.     Thyroid: No thyromegaly.  Cardiovascular:     Rate and Rhythm: Normal rate and regular rhythm.     Heart sounds: Normal heart sounds. No murmur.  Pulmonary:     Effort: Pulmonary effort is  normal. No respiratory distress.     Breath sounds: Normal breath sounds. No wheezing or rales.  Abdominal:     General: Bowel sounds are normal. There is no distension.     Palpations: Abdomen is soft.     Tenderness: There is no abdominal tenderness.  Lymphadenopathy:     Cervical: No cervical adenopathy.  Skin:    General: Skin is warm and dry.  Neurological:     Mental Status: She is alert and oriented to person, place, and time.     Deep Tendon Reflexes: Reflexes are normal and symmetric.  Psychiatric:        Behavior: Behavior normal.        Thought Content: Thought content normal.        Judgment: Judgment normal.     Diabetic Foot Exam - Simple   No data filed        Assessment & Plan:   Tiajuana was seen today for medical management of chronic issues.  Diagnoses and all orders for this visit:  Controlled type 2 diabetes mellitus without complication, without long-term current use of insulin (HCC) -     metFORMIN (GLUCOPHAGE) 500 MG tablet; Take 1 tablet (500 mg total) by mouth 2 (two) times daily with a meal. -     CBC with Differential/Platelet -     CMP14+EGFR -     Lipid panel -     Bayer DCA Hb A1c Waived  Mixed hyperlipidemia -     Lipid panel  Transaminitis  Diabetic lipidosis (HCC)  Essential hypertension  Alcoholism /alcohol abuse ([place])  Other orders -     valsartan (DIOVAN) 160 MG tablet; TAKE 1 TABLET BY MOUTH ONCE DAILY FOR BLOOD PRESSURE AND KIDNEY PROTECTION -     eszopiclone (LUNESTA) 2 MG TABS tablet; Take 1 tablet (2 mg total) by mouth at bedtime as needed for sleep.   I have discontinued Onedia Gandolfo's pantoprazole. I am also having her start on eszopiclone. Additionally, I am having her maintain her Blood Glucose Monitor System, Lancets Thin, glucose blood, traZODone, metFORMIN, and valsartan.  Meds ordered this encounter  Medications  . metFORMIN (GLUCOPHAGE) 500 MG tablet    Sig: Take 1 tablet (500 mg total) by mouth 2 (two)  times daily with a meal.    Dispense:  180 tablet    Refill:  1  . valsartan (DIOVAN) 160 MG tablet    Sig: TAKE 1 TABLET BY MOUTH ONCE DAILY FOR BLOOD PRESSURE AND KIDNEY PROTECTION    Dispense:  90 tablet    Refill:  1  . eszopiclone (LUNESTA) 2 MG TABS tablet    Sig: Take 1 tablet (2 mg total) by mouth at bedtime as needed for sleep.    Dispense:  30 tablet    Refill:  2     Follow-up: Return in about 3 months (around 02/21/2019).  [name], M.D.

## 2018-11-23 LAB — CBC WITH DIFFERENTIAL/PLATELET
Basophils Absolute: 0.1 10*3/uL (ref 0.0–0.2)
Basos: 1 %
EOS (ABSOLUTE): 0.2 10*3/uL (ref 0.0–0.4)
Eos: 4 %
Hematocrit: 38.8 % (ref 34.0–46.6)
Hemoglobin: 13.8 g/dL (ref 11.1–15.9)
Immature Grans (Abs): 0 10*3/uL (ref 0.0–0.1)
Immature Granulocytes: 0 %
Lymphocytes Absolute: 1.2 10*3/uL (ref 0.7–3.1)
Lymphs: 27 %
MCH: 36.1 pg — ABNORMAL HIGH (ref 26.6–33.0)
MCHC: 35.6 g/dL (ref 31.5–35.7)
MCV: 102 fL — ABNORMAL HIGH (ref 79–97)
Monocytes Absolute: 0.4 10*3/uL (ref 0.1–0.9)
Monocytes: 9 %
Neutrophils Absolute: 2.7 10*3/uL (ref 1.4–7.0)
Neutrophils: 59 %
Platelets: 180 10*3/uL (ref 150–450)
RBC: 3.82 x10E6/uL (ref 3.77–5.28)
RDW: 10.7 % — ABNORMAL LOW (ref 11.7–15.4)
WBC: 4.6 10*3/uL (ref 3.4–10.8)

## 2018-11-23 LAB — LIPID PANEL
Chol/HDL Ratio: 5.3 ratio — ABNORMAL HIGH (ref 0.0–4.4)
Cholesterol, Total: 175 mg/dL (ref 100–199)
HDL: 33 mg/dL — ABNORMAL LOW (ref >39)
LDL Calculated: 103 mg/dL — ABNORMAL HIGH (ref 0–99)
Triglycerides: 194 mg/dL — ABNORMAL HIGH (ref 0–149)
VLDL Cholesterol Cal: 39 mg/dL (ref 5–40)

## 2018-11-23 LAB — CMP14+EGFR
ALT: 59 IU/L — ABNORMAL HIGH (ref 0–32)
AST: 87 IU/L — ABNORMAL HIGH (ref 0–40)
Albumin/Globulin Ratio: 1.3 (ref 1.2–2.2)
Albumin: 3.9 g/dL (ref 3.8–4.8)
Alkaline Phosphatase: 78 IU/L (ref 39–117)
BUN/Creatinine Ratio: 8 — ABNORMAL LOW (ref 9–23)
BUN: 6 mg/dL (ref 6–24)
Bilirubin Total: 0.3 mg/dL (ref 0.0–1.2)
CO2: 21 mmol/L (ref 20–29)
Calcium: 9.6 mg/dL (ref 8.7–10.2)
Chloride: 101 mmol/L (ref 96–106)
Creatinine, Ser: 0.71 mg/dL (ref 0.57–1.00)
GFR calc Af Amer: 120 mL/min/{1.73_m2} (ref >59)
GFR calc non Af Amer: 104 mL/min/{1.73_m2} (ref >59)
Globulin, Total: 3 g/dL (ref 1.5–4.5)
Glucose: 202 mg/dL — ABNORMAL HIGH (ref 65–99)
Potassium: 4.1 mmol/L (ref 3.5–5.2)
Sodium: 137 mmol/L (ref 134–144)
Total Protein: 6.9 g/dL (ref 6.0–8.5)

## 2018-11-26 ENCOUNTER — Encounter: Payer: Self-pay | Admitting: Family Medicine

## 2019-02-26 ENCOUNTER — Ambulatory Visit (INDEPENDENT_AMBULATORY_CARE_PROVIDER_SITE_OTHER): Payer: BLUE CROSS/BLUE SHIELD | Admitting: Family Medicine

## 2019-02-26 ENCOUNTER — Other Ambulatory Visit: Payer: Self-pay

## 2019-02-26 ENCOUNTER — Encounter: Payer: Self-pay | Admitting: Family Medicine

## 2019-02-26 DIAGNOSIS — E782 Mixed hyperlipidemia: Secondary | ICD-10-CM

## 2019-02-26 DIAGNOSIS — E119 Type 2 diabetes mellitus without complications: Secondary | ICD-10-CM

## 2019-02-26 DIAGNOSIS — I10 Essential (primary) hypertension: Secondary | ICD-10-CM

## 2019-02-26 NOTE — Progress Notes (Signed)
Subjective:  Patient ID: Casey Norman,  female    DOB: [birth year]  Age: 43 y.o.    CC: No chief complaint on file.   HPI Casey Norman presents for  follow-up of hypertension. Patient has no history of headache chest pain or shortness of breath or recent cough. Patient also denies symptoms of TIA such as numbness weakness lateralizing. Patient denies side effects from medication. States taking it regularly.  Patient also  in for follow-up of elevated cholesterol. Doing well without complaints on current medication. Denies side effects  including myalgia and arthralgia and nausea. Also in today for liver function testing. Currently no chest pain, shortness of breath or other cardiovascular related symptoms noted.  Follow-up of diabetes. Patient does check blood sugar at home. Readings run between 100 and 140 Patient denies symptoms such as excessive hunger or urinary frequency, excessive hunger, nausea No significant hypoglycemic spells noted. Medications reviewed. Pt reports taking them regularly. Pt. denies complication/adverse reaction today.    History Casey Norman has a past medical history of Diabetes mellitus without complication ([place]).   She has no past surgical history on file.   Her family history includes Cancer in her father and paternal grandfather; Cancer (age of onset: 23) in her mother; Osteopenia in her mother; Stroke in her mother.She reports that she has been smoking. She has been smoking about 1.00 pack per day. She has never used smokeless tobacco. She reports current alcohol use. She reports that she does not use drugs.  Current Outpatient Medications on File Prior to Visit  Medication Sig Dispense Refill  . Blood Glucose Monitoring Suppl (BLOOD GLUCOSE MONITOR SYSTEM) w/Device KIT 1 Device by Does not apply route 2 (two) times daily. 1 each 0  . glucose blood test strip Use as instructed 100 each 12  . Lancets Thin MISC Use to check blood sugar twice daily 100 each  12  . metFORMIN (GLUCOPHAGE) 500 MG tablet Take 1 tablet (500 mg total) by mouth 2 (two) times daily with a meal. 180 tablet 1  . traZODone (DESYREL) 150 MG tablet 1 or 2 at bedtime for sleep 90 tablet 3  . valsartan (DIOVAN) 160 MG tablet TAKE 1 TABLET BY MOUTH ONCE DAILY FOR BLOOD PRESSURE AND KIDNEY PROTECTION 90 tablet 1   No current facility-administered medications on file prior to visit.     ROS Review of Systems  Constitutional: Negative.   HENT: Negative for congestion.   Eyes: Negative for visual disturbance.  Respiratory: Negative for shortness of breath.   Cardiovascular: Negative for chest pain.  Gastrointestinal: Negative for abdominal pain, constipation, diarrhea, nausea and vomiting.  Genitourinary: Negative for difficulty urinating.  Musculoskeletal: Negative for arthralgias and myalgias.  Neurological: Negative for headaches.  Psychiatric/Behavioral: Negative for sleep disturbance.    Objective:  There were no vitals taken for this visit.  BP Readings from Last 3 Encounters:  11/22/18 123/67  07/14/18 (!) 164/93  04/13/18 (!) 153/89    Wt Readings from Last 3 Encounters:  11/22/18 214 lb 6 oz (97.2 kg)  07/14/18 212 lb 4 oz (96.3 kg)  04/13/18 216 lb (98 kg)     Physical Exam  Exam deferred. Pt. Harboring due to COVID 19. Phone visit performed.     Assessment & Plan:   Diagnoses and all orders for this visit:  Controlled type 2 diabetes mellitus without complication, without long-term current use of insulin ([place])  Mixed hyperlipidemia  Essential hypertension   I have discontinued Olin Hauser Sobalvarro's eszopiclone. I  am also having her maintain her Blood Glucose Monitor System, Lancets Thin, glucose blood, traZODone, metFORMIN, and valsartan.  Virtual Visit via telephone Note  I discussed the limitations, risks, security and privacy concerns of performing an evaluation and management service by telephone and the availability of in person  appointments. I also discussed with the patient that there may be a patient responsible charge related to this service. The patient expressed understanding and agreed to proceed. Pt. Is at home. Dr. Livia Snellen is in his office.  Follow Up Instructions:   I discussed the assessment and treatment plan with the patient. The patient was provided an opportunity to ask questions and all were answered. The patient agreed with the plan and demonstrated an understanding of the instructions.   The patient was advised to call back or seek an in-person evaluation if the symptoms worsen or if the condition fails to improve as anticipated.  Visit started: 9:50` Call ended:  10:15 Total minutes including chart review and phone contact time: 30    Follow-up: Return in about 3 months (around 05/28/2019).  [name], M.D.

## 2019-04-12 ENCOUNTER — Encounter (INDEPENDENT_AMBULATORY_CARE_PROVIDER_SITE_OTHER): Payer: Self-pay

## 2019-07-26 ENCOUNTER — Other Ambulatory Visit: Payer: Self-pay

## 2019-07-26 ENCOUNTER — Encounter: Payer: Self-pay | Admitting: Family

## 2019-07-26 ENCOUNTER — Ambulatory Visit (INDEPENDENT_AMBULATORY_CARE_PROVIDER_SITE_OTHER): Payer: BC Managed Care – PPO | Admitting: Family

## 2019-07-26 DIAGNOSIS — R6889 Other general symptoms and signs: Secondary | ICD-10-CM

## 2019-07-26 DIAGNOSIS — Z20822 Contact with and (suspected) exposure to covid-19: Secondary | ICD-10-CM

## 2019-07-26 MED ORDER — BENZONATATE 200 MG PO CAPS: 200.0000 mg | ORAL_CAPSULE | Freq: Three times a day (TID) | ORAL | 1 refills | Status: DC | PRN

## 2019-07-26 MED ORDER — ALBUTEROL SULFATE HFA 108 (90 BASE) MCG/ACT IN AERS: 2.0000 | INHALATION_SPRAY | Freq: Four times a day (QID) | RESPIRATORY_TRACT | 0 refills | Status: DC | PRN

## 2019-07-26 NOTE — Progress Notes (Signed)
Virtual Visit via telephone Note Due to COVID-19 pandemic this visit was conducted virtually. This visit type was conducted due to national recommendations for restrictions regarding the COVID-19 Pandemic (e.g. social distancing, sheltering in place) in an effort to limit this patient's exposure and mitigate transmission in our community. All issues noted in this document were discussed and addressed.  A physical exam was not performed with this format.  I connected with Casey Norman on 07/26/19 at 1:35 pm by telephone and verified that I am speaking with the correct person using two identifiers. Casey Norman is currently located at home and son is currently with her during visit. The provider, Jannifer Rodney, FNP is located in their office at time of visit.  I discussed the limitations, risks, security and privacy concerns of performing an evaluation and management service by telephone and the availability of in person appointments. I also discussed with the patient that there may be a patient responsible charge related to this service. The patient expressed understanding and agreed to proceed.   History and Present Illness:  Cough This is a new problem. The current episode started in the past 7 days. The problem has been gradually worsening. The problem occurs every few minutes. The cough is non-productive. Associated symptoms include chills, a fever, headaches, myalgias, nasal congestion, postnasal drip, a sore throat, shortness of breath ("some times") and wheezing. Pertinent negatives include no ear congestion or ear pain. Risk factors for lung disease include smoking/tobacco exposure. She has tried rest for the symptoms. The treatment provided mild relief.      Review of Systems  Constitutional: Positive for chills and fever.  HENT: Positive for postnasal drip and sore throat. Negative for ear pain.   Respiratory: Positive for cough, shortness of breath ("some times") and wheezing.    Musculoskeletal: Positive for myalgias.  Neurological: Positive for headaches.  All other systems reviewed and are negative.    Observations/Objective: No SOB or distress noted  Assessment and Plan: 1. Suspected Covid-19 Virus Infection -PT will go get COVID tested today - Take meds as prescribed - Use a cool mist humidifier  -Use saline nose sprays frequently -Force fluids -For any cough or congestion  Use plain Mucinex- regular strength or max strength is fine -For fever or aces or pains- take tylenol or ibuprofen. -Throat lozenges if help -Call office if symptoms worsen or do not improve  - albuterol (VENTOLIN HFA) 108 (90 Base) MCG/ACT inhaler; Inhale 2 puffs into the lungs every 6 (six) hours as needed for wheezing or shortness of breath.  Dispense: 8 g; Refill: 0 - benzonatate (TESSALON) 200 MG capsule; Take 1 capsule (200 mg total) by mouth 3 (three) times daily as needed.  Dispense: 30 capsule; Refill: 1 - MyChart COVID-19 home monitoring program; Future     I discussed the assessment and treatment plan with the patient. The patient was provided an opportunity to ask questions and all were answered. The patient agreed with the plan and demonstrated an understanding of the instructions.   The patient was advised to call back or seek an in-person evaluation if the symptoms worsen or if the condition fails to improve as anticipated.  The above assessment and management plan was discussed with the patient. The patient verbalized understanding of and has agreed to the management plan. Patient is aware to call the clinic if symptoms persist or worsen. Patient is aware when to return to the clinic for a follow-up visit. Patient educated on when it is appropriate  to go to the emergency department.   Time call ended:  1:45 pm  I provided 10 minutes of non-face-to-face time during this encounter.    Evelina Dun, FNP

## 2019-07-27 ENCOUNTER — Encounter (INDEPENDENT_AMBULATORY_CARE_PROVIDER_SITE_OTHER): Payer: Self-pay

## 2019-07-27 LAB — NOVEL CORONAVIRUS, NAA: SARS-CoV-2, NAA: NOT DETECTED

## 2019-11-16 ENCOUNTER — Encounter: Payer: Self-pay | Admitting: Family Medicine

## 2019-11-16 ENCOUNTER — Other Ambulatory Visit: Payer: Self-pay

## 2019-11-16 ENCOUNTER — Ambulatory Visit (INDEPENDENT_AMBULATORY_CARE_PROVIDER_SITE_OTHER): Payer: Managed Care, Other (non HMO) | Admitting: Family Medicine

## 2019-11-16 DIAGNOSIS — I10 Essential (primary) hypertension: Secondary | ICD-10-CM

## 2019-11-16 DIAGNOSIS — E1159 Type 2 diabetes mellitus with other circulatory complications: Secondary | ICD-10-CM | POA: Diagnosis not present

## 2019-11-16 DIAGNOSIS — I152 Hypertension secondary to endocrine disorders: Secondary | ICD-10-CM

## 2019-11-16 DIAGNOSIS — E119 Type 2 diabetes mellitus without complications: Secondary | ICD-10-CM | POA: Diagnosis not present

## 2019-11-16 MED ORDER — METFORMIN HCL 500 MG PO TABS: 500.0000 mg | ORAL_TABLET | Freq: Two times a day (BID) | ORAL | 0 refills | Status: DC

## 2019-11-16 MED ORDER — HYDROCHLOROTHIAZIDE 12.5 MG PO CAPS: 12.5000 mg | ORAL_CAPSULE | Freq: Every day | ORAL | 0 refills | Status: DC

## 2019-11-16 NOTE — Progress Notes (Signed)
Virtual Visit via telephone Note Due to COVID-19 pandemic this visit was conducted virtually. This visit type was conducted due to national recommendations for restrictions regarding the COVID-19 Pandemic (e.g. social distancing, sheltering in place) in an effort to limit this patient's exposure and mitigate transmission in our community. All issues noted in this document were discussed and addressed.  A physical exam was not performed with this format.   I connected with Casey Norman on 11/16/2019 at [facility] by telephone and verified that I am speaking with the correct person using two identifiers. Casey Norman is currently located at home and family is currently with them during visit. The provider, Monia Pouch, FNP is located in their office at time of visit.  I discussed the limitations, risks, security and privacy concerns of performing an evaluation and management service by telephone and the availability of in person appointments. I also discussed with the patient that there may be a patient responsible charge related to this service. The patient expressed understanding and agreed to proceed.  Subjective:  Patient ID: Casey Norman, female    DOB: 1976/08/09, 44 y.o.   MRN: [id number]  Chief Complaint:  Hypertension and Diabetes   HPI: Casey Norman is a 44 y.o. female presenting on 11/16/2019 for Hypertension and Diabetes   Patient reports her blood pressure has been elevated over the last several days.  States she was out of her valsartan for a few weeks and restarted it 4 days ago.  States that she has had readings of 180/100, 184/105, 144/94, and 180/95.  States she has not been monitoring her sodium intake as well as she should.  She has not had an in office visit in several months.  Last labs were in 2020.  Patient states that she did have a slight headache and felt a little off when her blood pressure was this high.  She denies unilateral weakness, focal deficits, confusion,  slurred speech, facial droop, severe headache, or syncope.  No chest pain, palpitations, shortness of breath, or leg swelling.  She is also a diabetic and out of her metformin.  States her last blood sugar check was a couple of days ago.  States 165 at that time.  She denies polydipsia, polyphagia, or polyuria.  States her diabetes is usually well controlled and she is compliant with her medications when she has them.    Relevant past medical, surgical, family, and social history reviewed and updated as indicated.  Allergies and medications reviewed and updated.   Past Medical History:  Diagnosis Date  . Diabetes mellitus without complication ([place])     History reviewed. No pertinent surgical history.  Social History   Socioeconomic History  . Marital status: Married    Spouse name: Not on file  . Number of children: Not on file  . Years of education: Not on file  . Highest education level: Not on file  Occupational History  . Not on file  Tobacco Use  . Smoking status: Current Every Day Smoker    Packs/day: 1.00  . Smokeless tobacco: Never Used  Substance and Sexual Activity  . Alcohol use: Yes    Comment: 4-5 beers /day  . Drug use: No  . Sexual activity: Not on file  Other Topics Concern  . Not on file  Social History Narrative  . Not on file   Social Determinants of Health   Financial Resource Strain:   . Difficulty of Paying Living Expenses: Not on file  Food Insecurity:   .  Worried About Charity fundraiser in the Last Year: Not on file  . Ran Out of Food in the Last Year: Not on file  Transportation Needs:   . Lack of Transportation (Medical): Not on file  . Lack of Transportation (Non-Medical): Not on file  Physical Activity:   . Days of Exercise per Week: Not on file  . Minutes of Exercise per Session: Not on file  Stress:   . Feeling of Stress : Not on file  Social Connections:   . Frequency of Communication with Friends and Family: Not on file  .  Frequency of Social Gatherings with Friends and Family: Not on file  . Attends Religious Services: Not on file  . Active Member of Clubs or Organizations: Not on file  . Attends Archivist Meetings: Not on file  . Marital Status: Not on file  Intimate Partner Violence:   . Fear of Current or Ex-Partner: Not on file  . Emotionally Abused: Not on file  . Physically Abused: Not on file  . Sexually Abused: Not on file    Outpatient Encounter Medications as of 11/16/2019  Medication Sig  . albuterol (VENTOLIN HFA) 108 (90 Base) MCG/ACT inhaler Inhale 2 puffs into the lungs every 6 (six) hours as needed for wheezing or shortness of breath.  . benzonatate (TESSALON) 200 MG capsule Take 1 capsule (200 mg total) by mouth 3 (three) times daily as needed.  . Blood Glucose Monitoring Suppl (BLOOD GLUCOSE MONITOR SYSTEM) w/Device KIT 1 Device by Does not apply route 2 (two) times daily.  Marland Kitchen glucose blood test strip Use as instructed  . hydrochlorothiazide (MICROZIDE) 12.5 MG capsule Take 1 capsule (12.5 mg total) by mouth daily.  . Lancets Thin MISC Use to check blood sugar twice daily  . metFORMIN (GLUCOPHAGE) 500 MG tablet Take 1 tablet (500 mg total) by mouth 2 (two) times daily with a meal.  . traZODone (DESYREL) 150 MG tablet 1 or 2 at bedtime for sleep  . valsartan (DIOVAN) 160 MG tablet TAKE 1 TABLET BY MOUTH ONCE DAILY FOR BLOOD PRESSURE AND KIDNEY PROTECTION  . [DISCONTINUED] metFORMIN (GLUCOPHAGE) 500 MG tablet Take 1 tablet (500 mg total) by mouth 2 (two) times daily with a meal.   No facility-administered encounter medications on file as of 11/16/2019.    No Known Allergies  Review of Systems  Constitutional: Negative for activity change, appetite change, chills, diaphoresis, fatigue, fever and unexpected weight change.  HENT: Negative.   Eyes: Negative.  Negative for photophobia and visual disturbance.  Respiratory: Negative for cough, chest tightness and shortness of breath.    Cardiovascular: Negative for chest pain, palpitations and leg swelling.  Gastrointestinal: Negative for abdominal pain, blood in stool, constipation, diarrhea, nausea and vomiting.  Endocrine: Negative.  Negative for polydipsia, polyphagia and polyuria.  Genitourinary: Negative for decreased urine volume, difficulty urinating, dysuria, frequency and urgency.  Musculoskeletal: Negative for arthralgias and myalgias.  Skin: Negative.  Negative for color change and pallor.  Allergic/Immunologic: Negative.   Neurological: Positive for light-headedness and headaches. Negative for dizziness, tremors, seizures, syncope, facial asymmetry, speech difficulty, weakness and numbness.  Hematological: Negative.   Psychiatric/Behavioral: Negative for confusion, hallucinations, sleep disturbance and suicidal ideas.  All other systems reviewed and are negative.        Observations/Objective: No vital signs or physical exam, this was a telephone or virtual health encounter.  Pt alert and oriented, answers all questions appropriately, and able to speak in full sentences.  Assessment and Plan: Kingsley was seen today for hypertension and diabetes.  Diagnoses and all orders for this visit:  Hypertension associated with diabetes [facility]) Patient is currently on valsartan 160 mg once daily.  Restarted this medication 4 days ago.  Still having blood pressure readings of 180s over 100s.  Will initiate low-dose hydrochlorothiazide.  Patient aware she has to follow-up in office in 2 weeks for blood pressure check and lab work. DASH Diet discussed in detail.  No reported symptoms concerning for acute neurovascular event.  Patient made aware of these signs and symptoms in detail, aware to seek emergency care if any present.  Will initiate below.  Patient aware to report any new, worsening, or persistent symptoms.  Patient aware to report any persistent high or low blood pressure readings.  Appointment scheduled today  for follow-up. -     hydrochlorothiazide (MICROZIDE) 12.5 MG capsule; Take 1 capsule (12.5 mg total) by mouth daily.  Controlled type 2 diabetes mellitus without complication, without long-term current use of insulin ([place]) Patient out of Metformin.  Requesting refill today.  Patient aware she needs to be seen in office for lab work.  Will provide 30-day supply today.  Appointment scheduled for follow-up.  Diabetic diet and exercise encouraged. -     metFORMIN (GLUCOPHAGE) 500 MG tablet; Take 1 tablet (500 mg total) by mouth 2 (two) times daily with a meal.     Follow Up Instructions: Return in about 2 weeks (around 11/30/2019), or if symptoms worsen or fail to improve, for HTN, DM, labs.    I discussed the assessment and treatment plan with the patient. The patient was provided an opportunity to ask questions and all were answered. The patient agreed with the plan and demonstrated an understanding of the instructions.   The patient was advised to call back or seek an in-person evaluation if the symptoms worsen or if the condition fails to improve as anticipated.  The above assessment and management plan was discussed with the patient. The patient verbalized understanding of and has agreed to the management plan. Patient is aware to call the clinic if they develop any new symptoms or if symptoms persist or worsen. Patient is aware when to return to the clinic for a follow-up visit. Patient educated on when it is appropriate to go to the emergency department.    I provided 25 minutes of non-face-to-face time during this encounter. The call started at 0935. The call ended at 1000. The other time was used for coordination of care.    Monia Pouch, FNP-C [facility] [address] [place], [place] [address] [phone] 11/16/2019

## 2019-12-04 ENCOUNTER — Other Ambulatory Visit: Payer: Self-pay

## 2019-12-04 ENCOUNTER — Ambulatory Visit (INDEPENDENT_AMBULATORY_CARE_PROVIDER_SITE_OTHER): Payer: Managed Care, Other (non HMO) | Admitting: Family Medicine

## 2019-12-04 ENCOUNTER — Encounter: Payer: Self-pay | Admitting: Family Medicine

## 2019-12-04 VITALS — BP 133/81 | HR 74 | Temp 98.6°F | Ht 67.0 in | Wt 209.0 lb

## 2019-12-04 DIAGNOSIS — F5101 Primary insomnia: Secondary | ICD-10-CM

## 2019-12-04 DIAGNOSIS — I152 Hypertension secondary to endocrine disorders: Secondary | ICD-10-CM

## 2019-12-04 DIAGNOSIS — E782 Mixed hyperlipidemia: Secondary | ICD-10-CM

## 2019-12-04 DIAGNOSIS — E1169 Type 2 diabetes mellitus with other specified complication: Secondary | ICD-10-CM

## 2019-12-04 DIAGNOSIS — R7989 Other specified abnormal findings of blood chemistry: Secondary | ICD-10-CM

## 2019-12-04 DIAGNOSIS — I10 Essential (primary) hypertension: Secondary | ICD-10-CM | POA: Diagnosis not present

## 2019-12-04 DIAGNOSIS — E756 Lipid storage disorder, unspecified: Secondary | ICD-10-CM

## 2019-12-04 DIAGNOSIS — E119 Type 2 diabetes mellitus without complications: Secondary | ICD-10-CM

## 2019-12-04 DIAGNOSIS — E1159 Type 2 diabetes mellitus with other circulatory complications: Secondary | ICD-10-CM | POA: Diagnosis not present

## 2019-12-04 LAB — BAYER DCA HB A1C WAIVED: HB A1C (BAYER DCA - WAIVED): 5.8 % (ref <7.0)

## 2019-12-04 MED ORDER — HYDROCHLOROTHIAZIDE 12.5 MG PO CAPS: 12.5000 mg | ORAL_CAPSULE | Freq: Every day | ORAL | 0 refills | Status: DC

## 2019-12-04 MED ORDER — LANCETS THIN MISC: 12 refills | Status: AC

## 2019-12-04 MED ORDER — VALSARTAN 160 MG PO TABS: ORAL_TABLET | ORAL | 1 refills | Status: DC

## 2019-12-04 MED ORDER — METFORMIN HCL 500 MG PO TABS: 500.0000 mg | ORAL_TABLET | Freq: Two times a day (BID) | ORAL | 0 refills | Status: DC

## 2019-12-04 MED ORDER — GLUCOSE BLOOD VI STRP: ORAL_STRIP | 12 refills | Status: AC

## 2019-12-04 MED ORDER — MIRTAZAPINE 15 MG PO TABS: 15.0000 mg | ORAL_TABLET | Freq: Every day | ORAL | 5 refills | Status: DC

## 2019-12-04 NOTE — Progress Notes (Signed)
Subjective:  Patient ID: Casey Norman,  female    DOB: 06-03-1976  Age: 44 y.o.    CC: Follow-up   HPI Kendalyn Cranfield presents for  follow-up of hypertension. Patient has no history of headache chest pain or shortness of breath or recent cough. Patient also denies symptoms of TIA such as numbness weakness lateralizing. Patient denies side effects from medication. States taking it regularly.  Patient also  in for follow-up of elevated cholesterol. Doing well without complaints on current medication. Denies side effects  including myalgia and arthralgia and nausea. Also in today for liver function testing. Currently no chest pain, shortness of breath or other cardiovascular related symptoms noted.  Follow-up of diabetes. Patient does check blood sugar at home. Readings run between 140 and 180 postprandial. Patient denies symptoms such as excessive hunger or urinary frequency, excessive hunger, nausea No significant hypoglycemic spells noted. Medications reviewed. Pt reports taking them regularly. Pt. denies complication/adverse reaction today.   Sleep med, trazodone helps only with about 3 hours sleep per night.  History Cristalle has a past medical history of Diabetes mellitus without complication ([place]).   She has no past surgical history on file.   Her family history includes Cancer in her father and paternal grandfather; Cancer (age of onset: 88) in her mother; Osteopenia in her mother; Stroke in her mother.She reports that she has been smoking. She has been smoking about 1.00 pack per day. She has never used smokeless tobacco. She reports current alcohol use. She reports that she does not use drugs.  Current Outpatient Medications on File Prior to Visit  Medication Sig Dispense Refill  . Blood Glucose Monitoring Suppl (BLOOD GLUCOSE MONITOR SYSTEM) w/Device KIT 1 Device by Does not apply route 2 (two) times daily. 1 each 0  . traZODone (DESYREL) 150 MG tablet 1 or 2 at bedtime for  sleep 90 tablet 3   No current facility-administered medications on file prior to visit.    ROS Review of Systems  Constitutional: Negative.   HENT: Negative for congestion.   Eyes: Negative for visual disturbance.  Respiratory: Negative for shortness of breath.   Cardiovascular: Negative for chest pain.  Gastrointestinal: Negative for abdominal pain, constipation, diarrhea, nausea and vomiting.  Genitourinary: Negative for difficulty urinating.  Musculoskeletal: Negative for arthralgias and myalgias.  Neurological: Negative for headaches.  Psychiatric/Behavioral: Negative for sleep disturbance.    Objective:  BP 133/81   Pulse 74   Temp 98.6 F (37 C) (Temporal)   Ht 5' 7"  (1.702 m)   Wt 209 lb (94.8 kg)   BMI 32.73 kg/m   BP Readings from Last 3 Encounters:  12/04/19 133/81  11/22/18 123/67  07/14/18 (!) 164/93    Wt Readings from Last 3 Encounters:  12/04/19 209 lb (94.8 kg)  11/22/18 214 lb 6 oz (97.2 kg)  07/14/18 212 lb 4 oz (96.3 kg)     Physical Exam Constitutional:      General: She is not in acute distress.    Appearance: She is well-developed.  HENT:     Head: Normocephalic and atraumatic.  Eyes:     Conjunctiva/sclera: Conjunctivae normal.     Pupils: Pupils are equal, round, and reactive to light.  Neck:     Thyroid: No thyromegaly.  Cardiovascular:     Rate and Rhythm: Normal rate and regular rhythm.     Heart sounds: Normal heart sounds. No murmur.  Pulmonary:     Effort: Pulmonary effort is normal. No respiratory distress.  Breath sounds: Normal breath sounds. No wheezing or rales.  Abdominal:     General: Bowel sounds are normal. There is no distension.     Palpations: Abdomen is soft.     Tenderness: There is no abdominal tenderness.  Musculoskeletal:        General: Normal range of motion.     Cervical back: Normal range of motion and neck supple.  Lymphadenopathy:     Cervical: No cervical adenopathy.  Skin:    General: Skin  is warm and dry.  Neurological:     Mental Status: She is alert and oriented to person, place, and time.  Psychiatric:        Behavior: Behavior normal.        Thought Content: Thought content normal.        Judgment: Judgment normal.     Diabetic Foot Exam - Simple   No data filed        Assessment & Plan:   Gesenia was seen today for follow-up.  Diagnoses and all orders for this visit:  Controlled type 2 diabetes mellitus without complication, without long-term current use of insulin (H[facility]) -     CMP14+EGFR -     CBC with Differential/Platelet -     Bayer DCA Hb A1c Waived -     Microalbumin / creatinine urine ratio -     metFORMIN (GLUCOPHAGE) 500 MG tablet; Take 1 tablet (500 mg total) by mouth 2 (two) times daily with a meal.  Hypertension associated with diabetes ([place]) -     CMP14+EGFR -     CBC with Differential/Platelet -     Bayer DCA Hb A1c Waived -     Microalbumin / creatinine urine ratio -     hydrochlorothiazide (MICROZIDE) 12.5 MG capsule; Take 1 capsule (12.5 mg total) by mouth daily.  Mixed hyperlipidemia -     CMP14+EGFR -     CBC with Differential/Platelet -     Bayer DCA Hb A1c Waived -     Microalbumin / creatinine urine ratio  Essential hypertension -     CMP14+EGFR -     CBC with Differential/Platelet -     Bayer DCA Hb A1c Waived -     Microalbumin / creatinine urine ratio  Diabetic lipidosis (HCC) -     CMP14+EGFR -     CBC with Differential/Platelet -     Bayer DCA Hb A1c Waived -     Microalbumin / creatinine urine ratio  Elevated liver function tests -     CMP14+EGFR -     CBC with Differential/Platelet -     Bayer DCA Hb A1c Waived -     Microalbumin / creatinine urine ratio  Primary insomnia -     mirtazapine (REMERON) 15 MG tablet; Take 1 tablet (15 mg total) by mouth at bedtime. For sleep  Other orders -     valsartan (DIOVAN) 160 MG tablet; TAKE 1 TABLET BY MOUTH ONCE DAILY FOR BLOOD PRESSURE AND KIDNEY PROTECTION -      glucose blood test strip; Use as instructed -     Lancets Thin MISC; Use to check blood sugar twice daily   I have discontinued Olin Hauser Lunney's albuterol and benzonatate. I am also having her start on mirtazapine. Additionally, I am having her maintain her Blood Glucose Monitor System, traZODone, valsartan, metFORMIN, hydrochlorothiazide, glucose blood, and Lancets Thin.  Meds ordered this encounter  Medications  . valsartan (DIOVAN) 160 MG tablet  Sig: TAKE 1 TABLET BY MOUTH ONCE DAILY FOR BLOOD PRESSURE AND KIDNEY PROTECTION    Dispense:  90 tablet    Refill:  1  . metFORMIN (GLUCOPHAGE) 500 MG tablet    Sig: Take 1 tablet (500 mg total) by mouth 2 (two) times daily with a meal.    Dispense:  60 tablet    Refill:  0  . hydrochlorothiazide (MICROZIDE) 12.5 MG capsule    Sig: Take 1 capsule (12.5 mg total) by mouth daily.    Dispense:  30 capsule    Refill:  0  . glucose blood test strip    Sig: Use as instructed    Dispense:  100 each    Refill:  12  . Lancets Thin MISC    Sig: Use to check blood sugar twice daily    Dispense:  100 each    Refill:  12  . mirtazapine (REMERON) 15 MG tablet    Sig: Take 1 tablet (15 mg total) by mouth at bedtime. For sleep    Dispense:  30 tablet    Refill:  5     Follow-up: Return in about 3 months (around 03/02/2020).  [name], M.D.

## 2019-12-04 NOTE — Patient Instructions (Signed)

## 2019-12-05 LAB — CBC WITH DIFFERENTIAL/PLATELET
Basophils Absolute: 0.1 10*3/uL (ref 0.0–0.2)
Basos: 1 %
EOS (ABSOLUTE): 0.2 10*3/uL (ref 0.0–0.4)
Eos: 3 %
Hematocrit: 43.8 % (ref 34.0–46.6)
Hemoglobin: 15.1 g/dL (ref 11.1–15.9)
Immature Grans (Abs): 0 10*3/uL (ref 0.0–0.1)
Immature Granulocytes: 0 %
Lymphocytes Absolute: 1.7 10*3/uL (ref 0.7–3.1)
Lymphs: 30 %
MCH: 35.2 pg — ABNORMAL HIGH (ref 26.6–33.0)
MCHC: 34.5 g/dL (ref 31.5–35.7)
MCV: 102 fL — ABNORMAL HIGH (ref 79–97)
Monocytes Absolute: 0.5 10*3/uL (ref 0.1–0.9)
Monocytes: 9 %
Neutrophils Absolute: 3.1 10*3/uL (ref 1.4–7.0)
Neutrophils: 57 %
Platelets: 224 10*3/uL (ref 150–450)
RBC: 4.29 x10E6/uL (ref 3.77–5.28)
RDW: 10.6 % — ABNORMAL LOW (ref 11.7–15.4)
WBC: 5.6 10*3/uL (ref 3.4–10.8)

## 2019-12-05 LAB — CMP14+EGFR
ALT: 58 IU/L — ABNORMAL HIGH (ref 0–32)
AST: 73 IU/L — ABNORMAL HIGH (ref 0–40)
Albumin/Globulin Ratio: 1.5 (ref 1.2–2.2)
Albumin: 4.4 g/dL (ref 3.8–4.8)
Alkaline Phosphatase: 84 IU/L (ref 39–117)
BUN/Creatinine Ratio: 11 (ref 9–23)
BUN: 7 mg/dL (ref 6–24)
Bilirubin Total: 0.4 mg/dL (ref 0.0–1.2)
CO2: 23 mmol/L (ref 20–29)
Calcium: 10.4 mg/dL — ABNORMAL HIGH (ref 8.7–10.2)
Chloride: 100 mmol/L (ref 96–106)
Creatinine, Ser: 0.64 mg/dL (ref 0.57–1.00)
GFR calc Af Amer: 125 mL/min/{1.73_m2} (ref >59)
GFR calc non Af Amer: 108 mL/min/{1.73_m2} (ref >59)
Globulin, Total: 3 g/dL (ref 1.5–4.5)
Glucose: 138 mg/dL — ABNORMAL HIGH (ref 65–99)
Potassium: 4.1 mmol/L (ref 3.5–5.2)
Sodium: 138 mmol/L (ref 134–144)
Total Protein: 7.4 g/dL (ref 6.0–8.5)

## 2019-12-05 LAB — MICROALBUMIN / CREATININE URINE RATIO
Creatinine, Urine: 15.6 mg/dL
Microalb/Creat Ratio: 36 mg/g creat — ABNORMAL HIGH (ref 0–29)
Microalbumin, Urine: 5.6 ug/mL

## 2019-12-05 NOTE — Progress Notes (Signed)
Hello Ellenore,  Your lab result is normal and/or stable.Some minor variations that are not significant are commonly marked abnormal, but do not represent any medical problem for you.  Best regards, Djuan Talton, M.D.

## 2020-02-04 ENCOUNTER — Other Ambulatory Visit: Payer: Self-pay | Admitting: Family Medicine

## 2020-02-04 DIAGNOSIS — E119 Type 2 diabetes mellitus without complications: Secondary | ICD-10-CM

## 2020-03-03 ENCOUNTER — Ambulatory Visit (INDEPENDENT_AMBULATORY_CARE_PROVIDER_SITE_OTHER): Payer: Managed Care, Other (non HMO) | Admitting: Family Medicine

## 2020-03-03 ENCOUNTER — Encounter: Payer: Self-pay | Admitting: Family Medicine

## 2020-03-03 DIAGNOSIS — E1159 Type 2 diabetes mellitus with other circulatory complications: Secondary | ICD-10-CM | POA: Diagnosis not present

## 2020-03-03 DIAGNOSIS — E119 Type 2 diabetes mellitus without complications: Secondary | ICD-10-CM

## 2020-03-03 DIAGNOSIS — I10 Essential (primary) hypertension: Secondary | ICD-10-CM | POA: Diagnosis not present

## 2020-03-03 DIAGNOSIS — I152 Hypertension secondary to endocrine disorders: Secondary | ICD-10-CM

## 2020-03-03 MED ORDER — VALSARTAN 80 MG PO TABS: ORAL_TABLET | ORAL | 1 refills | Status: DC

## 2020-03-03 NOTE — Progress Notes (Addendum)
Subjective:  Patient ID: Casey Norman, female    DOB: [birth year]  Age: 44 y.o. MRN: [id number]  CC: No chief complaint on file.   HPI Casey Norman presents forFollow-up of diabetes. Patient checks blood sugar at home.   100 fasting and 170 postprandial Patient denies symptoms such as polyuria, polydipsia, excessive hunger, nausea No significant hypoglycemic spells noted. Medications reviewed. Pt reports taking them regularly without complication/adverse reaction being reported today.    presents for  follow-up of hypertension. Patient has no history of headache chest pain or shortness of breath or recent cough. Patient also denies symptoms of TIA such as focal numbness or weakness. Patient denies side effects from medication. States taking it regularly.Some systolic readings up to 284. Feels it is related to high stress level at work.    History Casey Norman has a past medical history of Diabetes mellitus without complication ([place]).   She has no past surgical history on file.   Her family history includes Cancer in her father and paternal grandfather; Cancer (age of onset: 56) in her mother; Osteopenia in her mother; Stroke in her mother.She reports that she has been smoking. She has been smoking about 1.00 pack per day. She has never used smokeless tobacco. She reports current alcohol use. She reports that she does not use drugs.  Current Outpatient Medications on File Prior to Visit  Medication Sig Dispense Refill  . Blood Glucose Monitoring Suppl (BLOOD GLUCOSE MONITOR SYSTEM) w/Device KIT 1 Device by Does not apply route 2 (two) times daily. 1 each 0  . glucose blood test strip Use as instructed 100 each 12  . hydrochlorothiazide (MICROZIDE) 12.5 MG capsule Take 1 capsule (12.5 mg total) by mouth daily. 30 capsule 0  . Lancets Thin MISC Use to check blood sugar twice daily 100 each 12  . metFORMIN (GLUCOPHAGE) 500 MG tablet TAKE 1 TABLET BY MOUTH TWICE DAILY WITH A MEAL 60 tablet 0    . mirtazapine (REMERON) 15 MG tablet Take 1 tablet (15 mg total) by mouth at bedtime. For sleep 30 tablet 5   No current facility-administered medications on file prior to visit.    ROS Review of Systems  Constitutional: Negative.   HENT: Negative for congestion.   Eyes: Negative for visual disturbance.  Respiratory: Negative for shortness of breath.   Cardiovascular: Negative for chest pain.  Gastrointestinal: Negative for abdominal pain, constipation, diarrhea, nausea and vomiting.  Genitourinary: Negative for difficulty urinating.  Musculoskeletal: Negative for arthralgias and myalgias.  Neurological: Negative for headaches.  Psychiatric/Behavioral: Negative for sleep disturbance.    Objective:  There were no vitals taken for this visit.  BP Readings from Last 3 Encounters:  12/04/19 133/81  11/22/18 123/67  07/14/18 (!) 164/93    Wt Readings from Last 3 Encounters:  12/04/19 209 lb (94.8 kg)  11/22/18 214 lb 6 oz (97.2 kg)  07/14/18 212 lb 4 oz (96.3 kg)     Physical Exam  Exam deferred. Pt. Harboring due to COVID 19. Phone visit performed.   Assessment & Plan:   Diagnoses and all orders for this visit:  Controlled type 2 diabetes mellitus without complication, without long-term current use of insulin ([place])  Hypertension associated with diabetes ([place])  Other orders -     valsartan (DIOVAN) 80 MG tablet; TAKE 1 TABLET BY MOUTH ONCE DAILY FOR BLOOD PRESSURE AND KIDNEY PROTECTION      I have discontinued Casey Norman's traZODone. I have also changed her valsartan. Additionally, I am having  her maintain her Blood Glucose Monitor System, hydrochlorothiazide, glucose blood, Lancets Thin, mirtazapine, and metFORMIN.  Meds ordered this encounter  Medications  . valsartan (DIOVAN) 80 MG tablet    Sig: TAKE 1 TABLET BY MOUTH ONCE DAILY FOR BLOOD PRESSURE AND KIDNEY PROTECTION    Dispense:  90 tablet    Refill:  1   Virtual Visit via telephone Note  I  discussed the limitations, risks, security and privacy concerns of performing an evaluation and management service by telephone and the availability of in person appointments. I also discussed with the patient that there may be a patient responsible charge related to this service. The patient expressed understanding and agreed to proceed. Pt. Is at home. Dr. Livia Snellen is in his office.  Follow Up Instructions:   I discussed the assessment and treatment plan with the patient. The patient was provided an opportunity to ask questions and all were answered. The patient agreed with the plan and demonstrated an understanding of the instructions.   The patient was advised to call back or seek an in-person evaluation if the symptoms worsen or if the condition fails to improve as anticipated.  Total minutes including chart review and phone contact time: 27   Follow-up: No follow-ups on file.  [name], M.D.

## 2020-03-25 ENCOUNTER — Other Ambulatory Visit: Payer: Self-pay | Admitting: Family Medicine

## 2020-03-25 DIAGNOSIS — E119 Type 2 diabetes mellitus without complications: Secondary | ICD-10-CM

## 2020-03-31 ENCOUNTER — Other Ambulatory Visit: Payer: Self-pay | Admitting: Family Medicine

## 2020-03-31 DIAGNOSIS — E119 Type 2 diabetes mellitus without complications: Secondary | ICD-10-CM

## 2020-04-07 ENCOUNTER — Other Ambulatory Visit: Payer: Self-pay | Admitting: Family Medicine

## 2020-04-07 DIAGNOSIS — E119 Type 2 diabetes mellitus without complications: Secondary | ICD-10-CM

## 2020-04-07 MED ORDER — METFORMIN HCL 500 MG PO TABS: ORAL_TABLET | ORAL | 1 refills | Status: DC

## 2020-04-07 NOTE — Telephone Encounter (Signed)
Pt aware refill sent to pharmacy 3 mos appt set for August

## 2020-06-09 ENCOUNTER — Ambulatory Visit: Payer: Managed Care, Other (non HMO) | Admitting: Family Medicine

## 2020-06-09 ENCOUNTER — Encounter: Payer: Self-pay | Admitting: Family Medicine

## 2020-06-09 ENCOUNTER — Other Ambulatory Visit: Payer: Self-pay | Admitting: Family Medicine

## 2020-06-09 ENCOUNTER — Other Ambulatory Visit: Payer: Self-pay

## 2020-06-09 VITALS — BP 129/82 | HR 96 | Temp 98.7°F | Ht 67.0 in | Wt 215.0 lb

## 2020-06-09 DIAGNOSIS — E119 Type 2 diabetes mellitus without complications: Secondary | ICD-10-CM

## 2020-06-09 DIAGNOSIS — I10 Essential (primary) hypertension: Secondary | ICD-10-CM

## 2020-06-09 DIAGNOSIS — E782 Mixed hyperlipidemia: Secondary | ICD-10-CM

## 2020-06-09 DIAGNOSIS — E1159 Type 2 diabetes mellitus with other circulatory complications: Secondary | ICD-10-CM | POA: Diagnosis not present

## 2020-06-09 DIAGNOSIS — R7989 Other specified abnormal findings of blood chemistry: Secondary | ICD-10-CM | POA: Diagnosis not present

## 2020-06-09 DIAGNOSIS — I152 Hypertension secondary to endocrine disorders: Secondary | ICD-10-CM

## 2020-06-09 LAB — BAYER DCA HB A1C WAIVED: HB A1C (BAYER DCA - WAIVED): 7.7 % — ABNORMAL HIGH (ref <7.0)

## 2020-06-10 ENCOUNTER — Encounter: Payer: Self-pay | Admitting: Family Medicine

## 2020-06-10 ENCOUNTER — Other Ambulatory Visit: Payer: Self-pay | Admitting: Family Medicine

## 2020-06-10 LAB — LIPID PANEL
Chol/HDL Ratio: 5 ratio — ABNORMAL HIGH (ref 0.0–4.4)
Cholesterol, Total: 208 mg/dL — ABNORMAL HIGH (ref 100–199)
HDL: 42 mg/dL (ref >39)
LDL Chol Calc (NIH): 115 mg/dL — ABNORMAL HIGH (ref 0–99)
Triglycerides: 291 mg/dL — ABNORMAL HIGH (ref 0–149)
VLDL Cholesterol Cal: 51 mg/dL — ABNORMAL HIGH (ref 5–40)

## 2020-06-10 LAB — CBC WITH DIFFERENTIAL/PLATELET
Basophils Absolute: 0.1 10*3/uL (ref 0.0–0.2)
Basos: 1 %
EOS (ABSOLUTE): 0.2 10*3/uL (ref 0.0–0.4)
Eos: 3 %
Hematocrit: 42.1 % (ref 34.0–46.6)
Hemoglobin: 15 g/dL (ref 11.1–15.9)
Immature Grans (Abs): 0 10*3/uL (ref 0.0–0.1)
Immature Granulocytes: 0 %
Lymphocytes Absolute: 1.9 10*3/uL (ref 0.7–3.1)
Lymphs: 32 %
MCH: 37.2 pg — ABNORMAL HIGH (ref 26.6–33.0)
MCHC: 35.6 g/dL (ref 31.5–35.7)
MCV: 105 fL — ABNORMAL HIGH (ref 79–97)
Monocytes Absolute: 0.5 10*3/uL (ref 0.1–0.9)
Monocytes: 9 %
Neutrophils Absolute: 3.2 10*3/uL (ref 1.4–7.0)
Neutrophils: 55 %
Platelets: 153 10*3/uL (ref 150–450)
RBC: 4.03 x10E6/uL (ref 3.77–5.28)
RDW: 11.1 % — ABNORMAL LOW (ref 11.7–15.4)
WBC: 5.8 10*3/uL (ref 3.4–10.8)

## 2020-06-10 LAB — CMP14+EGFR
ALT: 45 IU/L — ABNORMAL HIGH (ref 0–32)
AST: 66 IU/L — ABNORMAL HIGH (ref 0–40)
Albumin/Globulin Ratio: 1.5 (ref 1.2–2.2)
Albumin: 4.2 g/dL (ref 3.8–4.8)
Alkaline Phosphatase: 106 IU/L (ref 48–121)
BUN/Creatinine Ratio: 16 (ref 9–23)
BUN: 9 mg/dL (ref 6–24)
Bilirubin Total: 0.7 mg/dL (ref 0.0–1.2)
CO2: 23 mmol/L (ref 20–29)
Calcium: 9.2 mg/dL (ref 8.7–10.2)
Chloride: 96 mmol/L (ref 96–106)
Creatinine, Ser: 0.55 mg/dL — ABNORMAL LOW (ref 0.57–1.00)
GFR calc Af Amer: 131 mL/min/{1.73_m2} (ref >59)
GFR calc non Af Amer: 114 mL/min/{1.73_m2} (ref >59)
Globulin, Total: 2.8 g/dL (ref 1.5–4.5)
Glucose: 241 mg/dL — ABNORMAL HIGH (ref 65–99)
Potassium: 4 mmol/L (ref 3.5–5.2)
Sodium: 134 mmol/L (ref 134–144)
Total Protein: 7 g/dL (ref 6.0–8.5)

## 2020-06-10 MED ORDER — ROSUVASTATIN CALCIUM 10 MG PO TABS
10.0000 mg | ORAL_TABLET | Freq: Every day | ORAL | 1 refills | Status: DC
Start: 2020-06-10 — End: 2020-12-29

## 2020-06-10 MED ORDER — BENZONATATE 200 MG PO CAPS
200.0000 mg | ORAL_CAPSULE | Freq: Three times a day (TID) | ORAL | 2 refills | Status: DC | PRN
Start: 2020-06-10 — End: 2020-09-22

## 2020-06-10 NOTE — Progress Notes (Signed)
Subjective:  Patient ID: Casey Norman,  female    DOB: 09-06-1976  Age: 44 y.o.    CC: Follow-up (3 month), Numbness (Fingertips), and Cough (Increased after wearing mask for 3-4 days )   HPI Casey Norman presents for  follow-up of hypertension. Patient has no history of headache chest pain or shortness of breath or recent cough. Patient also denies symptoms of TIA such as numbness weakness lateralizing. Patient denies side effects from medication. States taking it regularly.  Patient also  in for follow-up of elevated cholesterol. Doing well without complaints on current medication. Denies side effects  including myalgia and arthralgia and nausea. Also in today for liver function testing. Currently no chest pain, shortness of breath or other cardiovascular related symptoms noted.  Follow-up of diabetes. Patient does check blood sugar at home occasionally. Readings not returned. Can be a little high at times, but good averall.  Patient denies symptoms such as excessive hunger or urinary frequency, excessive hunger, nausea No significant hypoglycemic spells noted. Medications reviewed. Pt reports taking them regularly. Pt. denies complication/adverse reaction today.    History Casey Norman has a past medical history of Diabetes mellitus without complication ([place]).   She has no past surgical history on file.   Her family history includes Cancer in her father and paternal grandfather; Cancer (age of onset: 18) in her mother; Osteopenia in her mother; Stroke in her mother.She reports that she has been smoking. She has been smoking about 1.00 pack per day. She has never used smokeless tobacco. She reports current alcohol use. She reports that she does not use drugs.  Current Outpatient Medications on File Prior to Visit  Medication Sig Dispense Refill  . Blood Glucose Monitoring Suppl (BLOOD GLUCOSE MONITOR SYSTEM) w/Device KIT 1 Device by Does not apply route 2 (two) times daily. 1 each 0  .  glucose blood test strip Use as instructed 100 each 12  . Lancets Thin MISC Use to check blood sugar twice daily 100 each 12  . valsartan (DIOVAN) 80 MG tablet TAKE 1 TABLET BY MOUTH ONCE DAILY FOR BLOOD PRESSURE AND KIDNEY PROTECTION 90 tablet 1  . mirtazapine (REMERON) 15 MG tablet Take 1 tablet (15 mg total) by mouth at bedtime. For sleep (Patient not taking: Reported on 06/09/2020) 30 tablet 5   No current facility-administered medications on file prior to visit.    ROS Review of Systems  Constitutional: Negative.   HENT: Negative.   Eyes: Negative for visual disturbance.  Respiratory: Positive for cough (When she has to wear her mask.  This is a minimum of 8 hours daily at work). Negative for shortness of breath.   Cardiovascular: Negative for chest pain.  Gastrointestinal: Negative for abdominal pain.  Musculoskeletal: Positive for back pain (tender spot at the mid back NKI). Negative for arthralgias.  Neurological: Positive for numbness (in fingertips).    Objective:  BP 129/82   Pulse 96   Temp 98.7 F (37.1 C) (Temporal)   Ht 5' 7" (1.702 m)   Wt 215 lb (97.5 kg)   BMI 33.67 kg/m   BP Readings from Last 3 Encounters:  06/09/20 129/82  12/04/19 133/81  11/22/18 123/67    Wt Readings from Last 3 Encounters:  06/09/20 215 lb (97.5 kg)  12/04/19 209 lb (94.8 kg)  11/22/18 214 lb 6 oz (97.2 kg)     Physical Exam Constitutional:      General: She is not in acute distress.    Appearance: She is  well-developed.  Cardiovascular:     Rate and Rhythm: Normal rate and regular rhythm.  Pulmonary:     Breath sounds: Normal breath sounds.  Skin:    General: Skin is warm and dry.  Neurological:     Mental Status: She is alert and oriented to person, place, and time.     Diabetic Foot Exam - Simple   Simple Foot Form Visual Inspection No deformities, no ulcerations, no other skin breakdown bilaterally: Yes Sensation Testing Intact to touch and monofilament testing  bilaterally: Yes Pulse Check Posterior Tibialis and Dorsalis pulse intact bilaterally: Yes Comments       Assessment & Plan:   Casey Norman was seen today for follow-up, numbness and cough.  Diagnoses and all orders for this visit:  Controlled type 2 diabetes mellitus without complication, without long-term current use of insulin (HCC) -     CBC with Differential/Platelet -     CMP14+EGFR -     Bayer DCA Hb A1c Waived  Hypertension associated with diabetes ([place]) -     CBC with Differential/Platelet -     CMP14+EGFR  Mixed hyperlipidemia -     CBC with Differential/Platelet -     CMP14+EGFR -     Lipid panel  Elevated liver function tests -     CBC with Differential/Platelet -     CMP14+EGFR  Other orders -     benzonatate (TESSALON) 200 MG capsule; Take 1 capsule (200 mg total) by mouth 3 (three) times daily as needed for cough.   I have discontinued Casey Norman's hydrochlorothiazide. I am also having her start on benzonatate. Additionally, I am having her maintain her Blood Glucose Monitor System, glucose blood, Lancets Thin, mirtazapine, and valsartan.  Meds ordered this encounter  Medications  . benzonatate (TESSALON) 200 MG capsule    Sig: Take 1 capsule (200 mg total) by mouth 3 (three) times daily as needed for cough.    Dispense:  30 capsule    Refill:  2     Follow-up: Return in about 3 months (around 09/09/2020).  [name], M.D.

## 2020-08-22 ENCOUNTER — Encounter: Payer: Self-pay | Admitting: *Deleted

## 2020-09-09 ENCOUNTER — Ambulatory Visit: Payer: Managed Care, Other (non HMO) | Admitting: Family Medicine

## 2020-09-10 ENCOUNTER — Other Ambulatory Visit: Payer: Self-pay | Admitting: *Deleted

## 2020-09-10 DIAGNOSIS — E119 Type 2 diabetes mellitus without complications: Secondary | ICD-10-CM

## 2020-09-10 MED ORDER — METFORMIN HCL 500 MG PO TABS: ORAL_TABLET | ORAL | 0 refills | Status: DC

## 2020-09-22 ENCOUNTER — Ambulatory Visit: Payer: Managed Care, Other (non HMO) | Admitting: Family Medicine

## 2020-09-22 ENCOUNTER — Other Ambulatory Visit: Payer: Self-pay

## 2020-09-22 ENCOUNTER — Encounter: Payer: Self-pay | Admitting: Family Medicine

## 2020-09-22 VITALS — BP 184/98 | HR 90 | Temp 98.5°F | Resp 20 | Ht 67.0 in | Wt 210.1 lb

## 2020-09-22 DIAGNOSIS — E1159 Type 2 diabetes mellitus with other circulatory complications: Secondary | ICD-10-CM

## 2020-09-22 DIAGNOSIS — E119 Type 2 diabetes mellitus without complications: Secondary | ICD-10-CM | POA: Diagnosis not present

## 2020-09-22 DIAGNOSIS — I152 Hypertension secondary to endocrine disorders: Secondary | ICD-10-CM

## 2020-09-22 DIAGNOSIS — E782 Mixed hyperlipidemia: Secondary | ICD-10-CM | POA: Diagnosis not present

## 2020-09-22 LAB — BAYER DCA HB A1C WAIVED: HB A1C (BAYER DCA - WAIVED): 7.2 % — ABNORMAL HIGH (ref <7.0)

## 2020-09-22 MED ORDER — METOPROLOL SUCCINATE ER 100 MG PO TB24: 100.0000 mg | ORAL_TABLET | Freq: Every day | ORAL | 2 refills | Status: DC

## 2020-09-22 NOTE — Progress Notes (Signed)
Subjective:  Patient ID: Casey Norman,  female    DOB: 09-20-1976  Age: 44 y.o.    CC: Medical Management of Chronic Issues   HPI Dyamon Sosinski presents for  follow-up of hypertension. Patient has no history of headache chest pain or shortness of breath or recent cough. Patient also denies symptoms of TIA such as numbness weakness lateralizing.  Patient could not tolerate the higher dose of Diovan.  As result the dose was decreased.  She tolerates that.  However she has not missed any doses prior to today's reading which is quite high. Patient also  in for follow-up of elevated cholesterol. Doing well without complaints on current medication. Denies side effects  including myalgia and arthralgia and nausea. Also in today for liver function testing. Currently no chest pain, shortness of breath or other cardiovascular related symptoms noted.  Follow-up of diabetes. Patient does not check blood sugar at home.  Patient denies symptoms such as excessive hunger or urinary frequency, excessive hunger, nausea No significant hypoglycemic spells noted. Medications reviewed. Pt reports taking them regularly. Pt. denies complication/adverse reaction today.  Patient states that she has been grieving her father who passed away of cancer on September 20, 2020.  The cancer was widespread there were several places on the liver.  It is unclear whether the liver was the primary however.  She is working on weight loss.  She is trying to eat less and get some exercise.  History Yuktha has a past medical history of Diabetes mellitus without complication ([place]).   She has no past surgical history on file.   Her family history includes Cancer in her father and paternal grandfather; Cancer (age of onset: 39) in her mother; Osteopenia in her mother; Stroke in her mother.She reports that she has been smoking. She has been smoking about 1.00 pack per day. She has never used smokeless tobacco. She reports current alcohol use.  She reports that she does not use drugs.  Current Outpatient Medications on File Prior to Visit  Medication Sig Dispense Refill  . Blood Glucose Monitoring Suppl (BLOOD GLUCOSE MONITOR SYSTEM) w/Device KIT 1 Device by Does not apply route 2 (two) times daily. 1 each 0  . glucose blood test strip Use as instructed 100 each 12  . Lancets Thin MISC Use to check blood sugar twice daily 100 each 12  . metFORMIN (GLUCOPHAGE) 500 MG tablet TAKE 1 TABLET BY MOUTH TWICE DAILY WITH A MEAL 180 tablet 0  . rosuvastatin (CRESTOR) 10 MG tablet Take 1 tablet (10 mg total) by mouth daily. For cholesterol 90 tablet 1  . mirtazapine (REMERON) 15 MG tablet Take 1 tablet (15 mg total) by mouth at bedtime. For Norman (Patient not taking: Reported on 06/09/2020) 30 tablet 5   No current facility-administered medications on file prior to visit.    ROS Review of Systems  Constitutional: Negative.   HENT: Negative.   Eyes: Negative for visual disturbance.  Respiratory: Negative for shortness of breath.   Cardiovascular: Negative for chest pain.  Gastrointestinal: Negative for abdominal pain.  Musculoskeletal: Negative for arthralgias.    Objective:  BP (!) 184/98   Pulse 90   Temp 98.5 F (36.9 C) (Temporal)   Resp 20   Ht _0  (1.702 m)   Wt 210 lb 2 oz (95.3 kg)   SpO2 98%   BMI 32.91 kg/m   BP Readings from Last 3 Encounters:  09/22/20 (!) 184/98  06/09/20 129/82  12/04/19 133/81    Wt  Readings from Last 3 Encounters:  09/22/20 210 lb 2 oz (95.3 kg)  06/09/20 215 lb (97.5 kg)  12/04/19 209 lb (94.8 kg)     Physical Exam Constitutional:      General: She is not in acute distress.    Appearance: She is well-developed.  HENT:     Head: Normocephalic and atraumatic.  Eyes:     Conjunctiva/sclera: Conjunctivae normal.     Pupils: Pupils are equal, round, and reactive to light.  Neck:     Thyroid: No thyromegaly.  Cardiovascular:     Rate and Rhythm: Normal rate and regular rhythm.      Heart sounds: Normal heart sounds. No murmur heard.   Pulmonary:     Effort: Pulmonary effort is normal. No respiratory distress.     Breath sounds: Normal breath sounds. No wheezing or rales.  Abdominal:     General: Bowel sounds are normal. There is no distension.     Palpations: Abdomen is soft.     Tenderness: There is no abdominal tenderness.  Musculoskeletal:        General: Normal range of motion.     Cervical back: Normal range of motion and neck supple.  Lymphadenopathy:     Cervical: No cervical adenopathy.  Skin:    General: Skin is warm and dry.  Neurological:     Mental Status: She is alert and oriented to person, place, and time.  Psychiatric:        Behavior: Behavior normal.        Thought Content: Thought content normal.        Judgment: Judgment normal.     Diabetic Foot Exam - Simple   Simple Foot Form Visual Inspection No deformities, no ulcerations, no other skin breakdown bilaterally: Yes Sensation Testing Intact to touch and monofilament testing bilaterally: Yes Pulse Check Posterior Tibialis and Dorsalis pulse intact bilaterally: Yes Comments       Assessment & Plan:   Janique was seen today for medical management of chronic issues.  Diagnoses and all orders for this visit:  Controlled type 2 diabetes mellitus without complication, without long-term current use of insulin ([place]) -     Bayer DCA Hb A1c Waived -     CBC with Differential/Platelet -     CMP14+EGFR  Hypertension associated with diabetes ([place]) -     CBC with Differential/Platelet -     CMP14+EGFR  Mixed hyperlipidemia -     CBC with Differential/Platelet -     CMP14+EGFR  Other orders -     metoprolol succinate (TOPROL-XL) 100 MG 24 hr tablet; Take 1 tablet (100 mg total) by mouth daily. For blood pressure control   I have discontinued Terilyn Rhinehart's valsartan and benzonatate. I am also having her start on metoprolol succinate. Additionally, I am having her maintain  her Blood Glucose Monitor System, glucose blood, Lancets Thin, mirtazapine, rosuvastatin, and metFORMIN.  Meds ordered this encounter  Medications  . metoprolol succinate (TOPROL-XL) 100 MG 24 hr tablet    Sig: Take 1 tablet (100 mg total) by mouth daily. For blood pressure control    Dispense:  30 tablet    Refill:  2   Since patient cannot tolerate higher doses of the valsartan that was discontinued as ineffective.  Instead she should use metoprolol daily.  Since her blood pressure was quite high I asked her to return in 1 month for recheck.  She should check her blood sugars in the meantime.  I  recommended fasting and 2 hours after the biggest meal of the day.  Follow-up: Return in about 1 month (around 10/22/2020) for hypertension.  [name], M.D.

## 2020-09-23 LAB — CMP14+EGFR
ALT: 54 IU/L — ABNORMAL HIGH (ref 0–32)
AST: 59 IU/L — ABNORMAL HIGH (ref 0–40)
Albumin/Globulin Ratio: 1.1 — ABNORMAL LOW (ref 1.2–2.2)
Albumin: 4.1 g/dL (ref 3.8–4.8)
Alkaline Phosphatase: 118 IU/L (ref 44–121)
BUN/Creatinine Ratio: 18 (ref 9–23)
BUN: 10 mg/dL (ref 6–24)
Bilirubin Total: 0.3 mg/dL (ref 0.0–1.2)
CO2: 26 mmol/L (ref 20–29)
Calcium: 9.4 mg/dL (ref 8.7–10.2)
Chloride: 98 mmol/L (ref 96–106)
Creatinine, Ser: 0.57 mg/dL (ref 0.57–1.00)
GFR calc Af Amer: 129 mL/min/{1.73_m2} (ref >59)
GFR calc non Af Amer: 112 mL/min/{1.73_m2} (ref >59)
Globulin, Total: 3.6 g/dL (ref 1.5–4.5)
Glucose: 163 mg/dL — ABNORMAL HIGH (ref 65–99)
Potassium: 4 mmol/L (ref 3.5–5.2)
Sodium: 138 mmol/L (ref 134–144)
Total Protein: 7.7 g/dL (ref 6.0–8.5)

## 2020-09-23 LAB — CBC WITH DIFFERENTIAL/PLATELET
Basophils Absolute: 0.1 10*3/uL (ref 0.0–0.2)
Basos: 1 %
EOS (ABSOLUTE): 0.2 10*3/uL (ref 0.0–0.4)
Eos: 3 %
Hematocrit: 43 % (ref 34.0–46.6)
Hemoglobin: 15 g/dL (ref 11.1–15.9)
Immature Grans (Abs): 0 10*3/uL (ref 0.0–0.1)
Immature Granulocytes: 0 %
Lymphocytes Absolute: 1.9 10*3/uL (ref 0.7–3.1)
Lymphs: 28 %
MCH: 35.1 pg — ABNORMAL HIGH (ref 26.6–33.0)
MCHC: 34.9 g/dL (ref 31.5–35.7)
MCV: 101 fL — ABNORMAL HIGH (ref 79–97)
Monocytes Absolute: 0.6 10*3/uL (ref 0.1–0.9)
Monocytes: 9 %
Neutrophils Absolute: 4 10*3/uL (ref 1.4–7.0)
Neutrophils: 59 %
Platelets: 160 10*3/uL (ref 150–450)
RBC: 4.27 x10E6/uL (ref 3.77–5.28)
RDW: 10.8 % — ABNORMAL LOW (ref 11.7–15.4)
WBC: 6.8 10*3/uL (ref 3.4–10.8)

## 2020-09-28 NOTE — Progress Notes (Signed)
Hello Shantee,  Your lab result is normal and/or stable.Some minor variations that are not significant are commonly marked abnormal, but do not represent any medical problem for you.  Best regards, Miracle Criado, M.D.

## 2020-10-28 ENCOUNTER — Other Ambulatory Visit: Payer: Self-pay

## 2020-10-28 ENCOUNTER — Ambulatory Visit: Payer: Managed Care, Other (non HMO) | Admitting: Family Medicine

## 2020-10-28 ENCOUNTER — Encounter: Payer: Self-pay | Admitting: Family Medicine

## 2020-10-28 VITALS — BP 165/88 | HR 83 | Temp 98.2°F | Ht 67.0 in | Wt 208.4 lb

## 2020-10-28 DIAGNOSIS — E1159 Type 2 diabetes mellitus with other circulatory complications: Secondary | ICD-10-CM | POA: Diagnosis not present

## 2020-10-28 DIAGNOSIS — R140 Abdominal distension (gaseous): Secondary | ICD-10-CM

## 2020-10-28 DIAGNOSIS — I152 Hypertension secondary to endocrine disorders: Secondary | ICD-10-CM | POA: Diagnosis not present

## 2020-10-28 MED ORDER — METOPROLOL SUCCINATE ER 200 MG PO TB24: 200.0000 mg | ORAL_TABLET | Freq: Every day | ORAL | 1 refills | Status: DC

## 2020-10-28 NOTE — Progress Notes (Signed)
Subjective:  Patient ID: Casey Norman, female    DOB: [birth year]  Age: 44 y.o. MRN: [id number]  CC: Hypertension   HPI [name] presents for  follow-up of hypertension. Patient has no history of headache chest pain or shortness of breath or recent cough. Patient also denies symptoms of TIA such as focal numbness or weakness.  Ms. Inthavong states that she has been dizzy and lightheaded since starting on the Toprol.  She started taking it at bedtime instead of in the morning that went away.  On a separate note she is getting bloated every evening and that lasts all night long.  It has not been associated with focal abdominal pain there is no nausea vomiting or diarrhea.    History Casey Norman has a past medical history of Diabetes mellitus without complication ([place]).   She has no past surgical history on file.   Her family history includes Cancer in her father and paternal grandfather; Cancer (age of onset: 31) in her mother; Osteopenia in her mother; Stroke in her mother.She reports that she has been smoking. She has been smoking about 1.00 pack per day. She has never used smokeless tobacco. She reports current alcohol use. She reports that she does not use drugs.  Current Outpatient Medications on File Prior to Visit  Medication Sig Dispense Refill  . Blood Glucose Monitoring Suppl (BLOOD GLUCOSE MONITOR SYSTEM) w/Device KIT 1 Device by Does not apply route 2 (two) times daily. 1 each 0  . glucose blood test strip Use as instructed 100 each 12  . Lancets Thin MISC Use to check blood sugar twice daily 100 each 12  . metFORMIN (GLUCOPHAGE) 500 MG tablet TAKE 1 TABLET BY MOUTH TWICE DAILY WITH A MEAL 180 tablet 0  . mirtazapine (REMERON) 15 MG tablet Take 1 tablet (15 mg total) by mouth at bedtime. For sleep 30 tablet 5  . rosuvastatin (CRESTOR) 10 MG tablet Take 1 tablet (10 mg total) by mouth daily. For cholesterol 90 tablet 1   No current facility-administered medications on file prior  to visit.    ROS Review of Systems  Constitutional: Negative.   HENT: Negative.   Eyes: Negative for visual disturbance.  Respiratory: Negative for shortness of breath.   Cardiovascular: Negative for chest pain.  Gastrointestinal: Positive for abdominal distention. Negative for abdominal pain.  Musculoskeletal: Negative for arthralgias.    Objective:  BP (!) 165/88   Pulse 83   Temp 98.2 F (36.8 C) (Temporal)   Ht 5' 7"  (1.702 m)   Wt 208 lb 6 oz (94.5 kg)   BMI 32.64 kg/m   BP Readings from Last 3 Encounters:  10/28/20 (!) 165/88  09/22/20 (!) 184/98  06/09/20 129/82    Wt Readings from Last 3 Encounters:  10/28/20 208 lb 6 oz (94.5 kg)  09/22/20 210 lb 2 oz (95.3 kg)  06/09/20 215 lb (97.5 kg)     Physical Exam Constitutional:      General: She is not in acute distress.    Appearance: She is well-developed.  HENT:     Head: Normocephalic and atraumatic.  Eyes:     Conjunctiva/sclera: Conjunctivae normal.     Pupils: Pupils are equal, round, and reactive to light.  Neck:     Thyroid: No thyromegaly.  Cardiovascular:     Rate and Rhythm: Normal rate and regular rhythm.     Heart sounds: Normal heart sounds. No murmur heard.   Pulmonary:     Effort: Pulmonary effort  is normal. No respiratory distress.     Breath sounds: Normal breath sounds. No wheezing or rales.  Abdominal:     General: Bowel sounds are normal. There is distension.     Palpations: Abdomen is soft.     Tenderness: There is abdominal tenderness (mild, diffuse).  Musculoskeletal:        General: Normal range of motion.     Cervical back: Normal range of motion and neck supple.  Lymphadenopathy:     Cervical: No cervical adenopathy.  Skin:    General: Skin is warm and dry.  Neurological:     Mental Status: She is alert and oriented to person, place, and time.  Psychiatric:        Behavior: Behavior normal.        Thought Content: Thought content normal.        Judgment: Judgment  normal.       Assessment & Plan:   Danyela was seen today for hypertension.  Diagnoses and all orders for this visit:  Hypertension associated with diabetes ([place])  Abdominal distension  Other orders -     metoprolol succinate (TOPROL-XL) 200 MG 24 hr tablet; Take 1 tablet (200 mg total) by mouth at bedtime. For blood pressure control   Allergies as of 10/28/2020   No Known Allergies     Medication List       Accurate as of October 28, 2020 11:59 PM. If you have any questions, ask your nurse or doctor.        Blood Glucose Monitor System w/Device Kit 1 Device by Does not apply route 2 (two) times daily.   glucose blood test strip Use as instructed   Lancets Thin Misc Use to check blood sugar twice daily   metFORMIN 500 MG tablet Commonly known as: GLUCOPHAGE TAKE 1 TABLET BY MOUTH TWICE DAILY WITH A MEAL   metoprolol 200 MG 24 hr tablet Commonly known as: TOPROL-XL Take 1 tablet (200 mg total) by mouth at bedtime. For blood pressure control What changed:   medication strength  how much to take  when to take this Changed by: [name], MD   mirtazapine 15 MG tablet Commonly known as: Remeron Take 1 tablet (15 mg total) by mouth at bedtime. For sleep   rosuvastatin 10 MG tablet Commonly known as: Crestor Take 1 tablet (10 mg total) by mouth daily. For cholesterol       Meds ordered this encounter  Medications  . metoprolol succinate (TOPROL-XL) 200 MG 24 hr tablet    Sig: Take 1 tablet (200 mg total) by mouth at bedtime. For blood pressure control    Dispense:  90 tablet    Refill:  1       Follow-up: Return in about 2 months (around 12/29/2020) for hypertension, diabetes.  [name], M.D.

## 2020-10-29 ENCOUNTER — Encounter: Payer: Self-pay | Admitting: Family Medicine

## 2020-10-30 ENCOUNTER — Other Ambulatory Visit: Payer: Self-pay | Admitting: Family Medicine

## 2020-10-30 DIAGNOSIS — E119 Type 2 diabetes mellitus without complications: Secondary | ICD-10-CM

## 2020-12-29 ENCOUNTER — Other Ambulatory Visit: Payer: Self-pay

## 2020-12-29 ENCOUNTER — Encounter: Payer: Self-pay | Admitting: Family Medicine

## 2020-12-29 ENCOUNTER — Ambulatory Visit: Payer: Managed Care, Other (non HMO) | Admitting: Family Medicine

## 2020-12-29 VITALS — BP 167/83 | HR 77 | Temp 98.2°F | Ht 67.0 in | Wt 213.8 lb

## 2020-12-29 DIAGNOSIS — E782 Mixed hyperlipidemia: Secondary | ICD-10-CM | POA: Diagnosis not present

## 2020-12-29 DIAGNOSIS — E1159 Type 2 diabetes mellitus with other circulatory complications: Secondary | ICD-10-CM | POA: Diagnosis not present

## 2020-12-29 DIAGNOSIS — E119 Type 2 diabetes mellitus without complications: Secondary | ICD-10-CM

## 2020-12-29 DIAGNOSIS — I152 Hypertension secondary to endocrine disorders: Secondary | ICD-10-CM | POA: Diagnosis not present

## 2020-12-29 LAB — BAYER DCA HB A1C WAIVED: HB A1C (BAYER DCA - WAIVED): 8.1 % — ABNORMAL HIGH (ref <7.0)

## 2020-12-29 MED ORDER — METOPROLOL SUCCINATE ER 200 MG PO TB24: 200.0000 mg | ORAL_TABLET | Freq: Every day | ORAL | 1 refills | Status: DC

## 2020-12-29 MED ORDER — ROSUVASTATIN CALCIUM 10 MG PO TABS: 10.0000 mg | ORAL_TABLET | Freq: Every day | ORAL | 1 refills | Status: DC

## 2020-12-29 MED ORDER — METFORMIN HCL ER 500 MG PO TB24: 500.0000 mg | ORAL_TABLET | Freq: Two times a day (BID) | ORAL | 1 refills | Status: DC

## 2020-12-29 NOTE — Progress Notes (Signed)
Subjective:  Patient ID: Casey Norman,  female    DOB: [birth year]  Age: 45 y.o.    CC: Hypertension and Diabetes (2 month follow up/)   HPI Casey Norman presents for  follow-up of hypertension. Patient has no history of headache chest pain or shortness of breath or recent cough. Patient also denies symptoms of TIA such as numbness weakness lateralizing. Patient denies side effects from medication. States taking it regularly.  Patient also  in for follow-up of elevated cholesterol. Doing well without complaints on current medication. Denies side effects  including myalgia and arthralgia and nausea. Also in today for liver function testing. Currently no chest pain, shortness of breath or other cardiovascular related symptoms noted.  Follow-up of diabetes. Patient does check blood sugar at home. Frustrated that in spite of working out with [employer] 6 days a weeks and following low carb diet, she has gained weight.  Patient denies symptoms such as excessive hunger or urinary frequency, excessive hunger, nausea No significant hypoglycemic spells noted. Medications reviewed. Pt reports taking them regularly. Pt. denies complication/adverse reaction today.    History Casey Norman has a past medical history of Diabetes mellitus without complication ([place]).   She has no past surgical history on file.   Her family history includes Cancer in her father and paternal grandfather; Cancer (age of onset: 71) in her mother; Osteopenia in her mother; Stroke in her mother.She reports that she has been smoking. She has been smoking about 1.00 pack per day. She has never used smokeless tobacco. She reports current alcohol use. She reports that she does not use drugs.  Current Outpatient Medications on File Prior to Visit  Medication Sig Dispense Refill  . Blood Glucose Monitoring Suppl (BLOOD GLUCOSE MONITOR SYSTEM) w/Device KIT 1 Device by Does not apply route 2 (two) times daily. 1 each 0  . glucose blood  test strip Use as instructed 100 each 12  . Lancets Thin MISC Use to check blood sugar twice daily 100 each 12   No current facility-administered medications on file prior to visit.    ROS Review of Systems  Constitutional: Negative.   HENT: Negative.   Eyes: Negative for visual disturbance.  Respiratory: Negative for shortness of breath.   Cardiovascular: Negative for chest pain.  Gastrointestinal: Negative for abdominal pain.  Musculoskeletal: Negative for arthralgias.    Objective:  BP (!) 167/83   Pulse 77   Temp 98.2 F (36.8 C) (Temporal)   Ht 5' 7"  (1.702 m)   Wt 213 lb 12.8 oz (97 kg)   SpO2 99%   BMI 33.49 kg/m   BP Readings from Last 3 Encounters:  12/29/20 (!) 167/83  10/28/20 (!) 165/88  09/22/20 (!) 184/98    Wt Readings from Last 3 Encounters:  12/29/20 213 lb 12.8 oz (97 kg)  10/28/20 208 lb 6 oz (94.5 kg)  09/22/20 210 lb 2 oz (95.3 kg)     Physical Exam Constitutional:      General: She is not in acute distress.    Appearance: She is well-developed.  HENT:     Head: Normocephalic and atraumatic.  Eyes:     Conjunctiva/sclera: Conjunctivae normal.     Pupils: Pupils are equal, round, and reactive to light.  Neck:     Thyroid: No thyromegaly.  Cardiovascular:     Rate and Rhythm: Normal rate and regular rhythm.     Heart sounds: Normal heart sounds. No murmur heard.   Pulmonary:  Effort: Pulmonary effort is normal. No respiratory distress.     Breath sounds: Normal breath sounds. No wheezing or rales.  Abdominal:     General: Bowel sounds are normal. There is no distension.     Palpations: Abdomen is soft.     Tenderness: There is no abdominal tenderness.  Musculoskeletal:        General: Normal range of motion.     Cervical back: Normal range of motion and neck supple.  Lymphadenopathy:     Cervical: No cervical adenopathy.  Skin:    General: Skin is warm and dry.  Neurological:     Mental Status: She is alert and oriented to  person, place, and time.  Psychiatric:        Behavior: Behavior normal.        Thought Content: Thought content normal.        Judgment: Judgment normal.         Assessment & Plan:   Casey Norman was seen today for hypertension and diabetes.  Diagnoses and all orders for this visit:  Hypertension associated with diabetes ([place]) -     Microalbumin / creatinine urine ratio -     Bayer DCA Hb A1c Waived -     CBC with Differential/Platelet -     CMP14+EGFR -     Lipid panel -     rosuvastatin (CRESTOR) 10 MG tablet; Take 1 tablet (10 mg total) by mouth daily. For cholesterol -     metFORMIN (GLUCOPHAGE-XR) 500 MG 24 hr tablet; Take 1 tablet (500 mg total) by mouth 2 (two) times daily. -     metoprolol (TOPROL-XL) 200 MG 24 hr tablet; Take 1 tablet (200 mg total) by mouth at bedtime. For blood pressure control  Mixed hyperlipidemia -     CBC with Differential/Platelet -     CMP14+EGFR -     Lipid panel -     rosuvastatin (CRESTOR) 10 MG tablet; Take 1 tablet (10 mg total) by mouth daily. For cholesterol   I have discontinued Casey Norman's mirtazapine and metFORMIN. I have also changed her metoprolol. Additionally, I am having her start on metFORMIN. Lastly, I am having her maintain her Blood Glucose Monitor System, glucose blood, Lancets Thin, and rosuvastatin.  Meds ordered this encounter  Medications  . rosuvastatin (CRESTOR) 10 MG tablet    Sig: Take 1 tablet (10 mg total) by mouth daily. For cholesterol    Dispense:  90 tablet    Refill:  1  . metFORMIN (GLUCOPHAGE-XR) 500 MG 24 hr tablet    Sig: Take 1 tablet (500 mg total) by mouth 2 (two) times daily.    Dispense:  180 tablet    Refill:  1  . metoprolol (TOPROL-XL) 200 MG 24 hr tablet    Sig: Take 1 tablet (200 mg total) by mouth at bedtime. For blood pressure control    Dispense:  90 tablet    Refill:  1     Follow-up: Return in about 3 months (around 03/28/2021) for diabetes.  [name], M.D.

## 2020-12-30 ENCOUNTER — Other Ambulatory Visit: Payer: Self-pay

## 2020-12-30 DIAGNOSIS — R7989 Other specified abnormal findings of blood chemistry: Secondary | ICD-10-CM

## 2020-12-30 LAB — LIPID PANEL
Chol/HDL Ratio: 3.3 ratio (ref 0.0–4.4)
Cholesterol, Total: 139 mg/dL (ref 100–199)
HDL: 42 mg/dL (ref >39)
LDL Chol Calc (NIH): 71 mg/dL (ref 0–99)
Triglycerides: 149 mg/dL (ref 0–149)
VLDL Cholesterol Cal: 26 mg/dL (ref 5–40)

## 2020-12-30 LAB — CBC WITH DIFFERENTIAL/PLATELET
Basophils Absolute: 0.1 10*3/uL (ref 0.0–0.2)
Basos: 1 %
EOS (ABSOLUTE): 0.2 10*3/uL (ref 0.0–0.4)
Eos: 4 %
Hematocrit: 39 % (ref 34.0–46.6)
Hemoglobin: 13.5 g/dL (ref 11.1–15.9)
Immature Grans (Abs): 0 10*3/uL (ref 0.0–0.1)
Immature Granulocytes: 0 %
Lymphocytes Absolute: 1.6 10*3/uL (ref 0.7–3.1)
Lymphs: 35 %
MCH: 35.2 pg — ABNORMAL HIGH (ref 26.6–33.0)
MCHC: 34.6 g/dL (ref 31.5–35.7)
MCV: 102 fL — ABNORMAL HIGH (ref 79–97)
Monocytes Absolute: 0.4 10*3/uL (ref 0.1–0.9)
Monocytes: 9 %
Neutrophils Absolute: 2.3 10*3/uL (ref 1.4–7.0)
Neutrophils: 51 %
Platelets: 151 10*3/uL (ref 150–450)
RBC: 3.84 x10E6/uL (ref 3.77–5.28)
RDW: 11.1 % — ABNORMAL LOW (ref 11.7–15.4)
WBC: 4.5 10*3/uL (ref 3.4–10.8)

## 2020-12-30 LAB — CMP14+EGFR
ALT: 34 IU/L — ABNORMAL HIGH (ref 0–32)
AST: 69 IU/L — ABNORMAL HIGH (ref 0–40)
Albumin/Globulin Ratio: 1.3 (ref 1.2–2.2)
Albumin: 3.9 g/dL (ref 3.8–4.8)
Alkaline Phosphatase: 117 IU/L (ref 44–121)
BUN/Creatinine Ratio: 11 (ref 9–23)
BUN: 7 mg/dL (ref 6–24)
Bilirubin Total: 0.4 mg/dL (ref 0.0–1.2)
CO2: 23 mmol/L (ref 20–29)
Calcium: 9 mg/dL (ref 8.7–10.2)
Chloride: 97 mmol/L (ref 96–106)
Creatinine, Ser: 0.62 mg/dL (ref 0.57–1.00)
Globulin, Total: 3.1 g/dL (ref 1.5–4.5)
Glucose: 218 mg/dL — ABNORMAL HIGH (ref 65–99)
Potassium: 4.2 mmol/L (ref 3.5–5.2)
Sodium: 135 mmol/L (ref 134–144)
Total Protein: 7 g/dL (ref 6.0–8.5)
eGFR: 111 mL/min/{1.73_m2} (ref >59)

## 2021-01-01 LAB — SPECIMEN STATUS REPORT

## 2021-01-01 LAB — B12 AND FOLATE PANEL
Folate: 9.1 ng/mL (ref >3.0)
Vitamin B-12: 402 pg/mL (ref 232–1245)

## 2021-01-02 NOTE — Progress Notes (Signed)
Hello Nithya,  Your lab result is normal and/or stable.Some minor variations that are not significant are commonly marked abnormal, but do not represent any medical problem for you.  Best regards, Saidy Ormand, M.D.

## 2021-02-17 ENCOUNTER — Encounter: Payer: Self-pay | Admitting: *Deleted

## 2021-04-02 ENCOUNTER — Ambulatory Visit: Payer: Managed Care, Other (non HMO) | Admitting: Family Medicine

## 2021-04-13 ENCOUNTER — Ambulatory Visit (INDEPENDENT_AMBULATORY_CARE_PROVIDER_SITE_OTHER): Payer: Managed Care, Other (non HMO) | Admitting: Family Medicine

## 2021-04-13 ENCOUNTER — Encounter: Payer: Self-pay | Admitting: Family Medicine

## 2021-04-13 ENCOUNTER — Other Ambulatory Visit: Payer: Self-pay

## 2021-04-13 VITALS — BP 150/86 | HR 77 | Temp 98.4°F | Ht 67.0 in | Wt 211.4 lb

## 2021-04-13 DIAGNOSIS — E782 Mixed hyperlipidemia: Secondary | ICD-10-CM

## 2021-04-13 DIAGNOSIS — E119 Type 2 diabetes mellitus without complications: Secondary | ICD-10-CM | POA: Diagnosis not present

## 2021-04-13 DIAGNOSIS — F1020 Alcohol dependence, uncomplicated: Secondary | ICD-10-CM | POA: Diagnosis not present

## 2021-04-13 LAB — BAYER DCA HB A1C WAIVED: HB A1C (BAYER DCA - WAIVED): 6.7 % (ref <7.0)

## 2021-04-13 MED ORDER — CHLORTHALIDONE 25 MG PO TABS: 25.0000 mg | ORAL_TABLET | Freq: Every day | ORAL | 5 refills | Status: DC

## 2021-04-13 MED ORDER — OZEMPIC (0.25 OR 0.5 MG/DOSE) 2 MG/1.5ML ~~LOC~~ SOPN: 0.2500 mg | PEN_INJECTOR | SUBCUTANEOUS | 2 refills | Status: DC

## 2021-04-13 NOTE — Progress Notes (Signed)
Subjective:  Patient ID: Casey Norman,  female    DOB: [birth year]  Age: 45 y.o.    CC: Medical Management of Chronic Issues   HPI Casey Norman presents for  follow-up of hypertension. Patient has no history of headache chest pain or shortness of breath or recent cough. Patient also denies symptoms of TIA such as numbness weakness lateralizing. Patient denies side effects from medication. States taking it regularly.  Patient also  in for follow-up of elevated cholesterol. Doing well without complaints on current medication. Denies side effects  including myalgia and arthralgia and nausea. Also in today for liver function testing. Currently no chest pain, shortness of breath or other cardiovascular related symptoms noted.  Follow-up of diabetes. Patient does check blood sugar at home. Readings run between 160+ in the morning and not measuring prandial.  Patient denies symptoms such as excessive hunger or urinary frequency, excessive hunger, nausea No significant hypoglycemic spells noted. Medications reviewed. Pt reports taking them regularly. Pt. denies complication/adverse reaction today.   Drinking 6 beers a day. Help her sleep.   Wants to use GLP 1 for weight loss as well as DM control.   History [name] has a past medical history of Diabetes mellitus without complication ([place]).   She has no past surgical history on file.   Her family history includes Cancer in her father and paternal grandfather; Cancer (age of onset: 17) in her mother; Osteopenia in her mother; Stroke in her mother.She reports that she has been smoking. She has been smoking an average of 1.00 packs per day. She has never used smokeless tobacco. She reports current alcohol use. She reports that she does not use drugs.  Current Outpatient Medications on File Prior to Visit  Medication Sig Dispense Refill   Blood Glucose Monitoring Suppl (BLOOD GLUCOSE MONITOR SYSTEM) w/Device KIT 1 Device by Does not apply  route 2 (two) times daily. 1 each 0   glucose blood test strip Use as instructed 100 each 12   Lancets Thin MISC Use to check blood sugar twice daily 100 each 12   metFORMIN (GLUCOPHAGE-XR) 500 MG 24 hr tablet Take 1 tablet (500 mg total) by mouth 2 (two) times daily. 180 tablet 1   metoprolol (TOPROL-XL) 200 MG 24 hr tablet Take 1 tablet (200 mg total) by mouth at bedtime. For blood pressure control 90 tablet 1   rosuvastatin (CRESTOR) 10 MG tablet Take 1 tablet (10 mg total) by mouth daily. For cholesterol 90 tablet 1   No current facility-administered medications on file prior to visit.    ROS Review of Systems  Constitutional: Negative.   HENT: Negative.    Eyes:  Negative for visual disturbance.  Respiratory:  Negative for shortness of breath.   Cardiovascular:  Negative for chest pain.  Gastrointestinal:  Negative for abdominal pain.  Musculoskeletal:  Negative for arthralgias.   Objective:  BP (!) 150/86   Pulse 77   Temp 98.4 F (36.9 C)   Ht _0  (1.702 m)   Wt 211 lb 6.4 oz (95.9 kg)   SpO2 98%   BMI 33.11 kg/m   BP Readings from Last 3 Encounters:  04/13/21 (!) 150/86  12/29/20 (!) 167/83  10/28/20 (!) 165/88    Wt Readings from Last 3 Encounters:  04/13/21 211 lb 6.4 oz (95.9 kg)  12/29/20 213 lb 12.8 oz (97 kg)  10/28/20 208 lb 6 oz (94.5 kg)     Physical Exam Constitutional:      General:  She is not in acute distress.    Appearance: She is well-developed.  HENT:     Head: Normocephalic and atraumatic.  Eyes:     Conjunctiva/sclera: Conjunctivae normal.     Pupils: Pupils are equal, round, and reactive to light.  Neck:     Thyroid: No thyromegaly.  Cardiovascular:     Rate and Rhythm: Normal rate and regular rhythm.     Heart sounds: Normal heart sounds. No murmur heard. Pulmonary:     Effort: Pulmonary effort is normal. No respiratory distress.     Breath sounds: Normal breath sounds. No wheezing or rales.  Abdominal:     General: Bowel  sounds are normal. There is no distension.     Palpations: Abdomen is soft.     Tenderness: There is no abdominal tenderness.  Musculoskeletal:        General: Normal range of motion.     Cervical back: Normal range of motion and neck supple.  Lymphadenopathy:     Cervical: No cervical adenopathy.  Skin:    General: Skin is warm and dry.  Neurological:     Mental Status: She is alert and oriented to person, place, and time.  Psychiatric:        Behavior: Behavior normal.        Thought Content: Thought content normal.        Judgment: Judgment normal.         Assessment & Plan:   Casey Norman was seen today for medical management of chronic issues.  Diagnoses and all orders for this visit:  Controlled type 2 diabetes mellitus without complication, without long-term current use of insulin (HCC) -     Bayer DCA Hb A1c Waived -     CBC with Differential/Platelet -     CMP14+EGFR  Mixed hyperlipidemia -     Lipid panel  Alcohol dependence, uncomplicated (HCC)  Other orders -     chlorthalidone (HYGROTON) 25 MG tablet; Take 1 tablet (25 mg total) by mouth daily. -     Semaglutide,0.25 or 0.5MG/DOS, (OZEMPIC, 0.25 OR 0.5 MG/DOSE,) 2 MG/1.5ML SOPN; Inject 0.25 mg into the skin once a week.  I am having Derinda Late start on chlorthalidone and Ozempic (0.25 or 0.5 MG/DOSE). I am also having her maintain her Blood Glucose Monitor System, glucose blood, Lancets Thin, rosuvastatin, metFORMIN, and metoprolol.  Meds ordered this encounter  Medications   chlorthalidone (HYGROTON) 25 MG tablet    Sig: Take 1 tablet (25 mg total) by mouth daily.    Dispense:  30 tablet    Refill:  5   Semaglutide,0.25 or 0.5MG/DOS, (OZEMPIC, 0.25 OR 0.5 MG/DOSE,) 2 MG/1.5ML SOPN    Sig: Inject 0.25 mg into the skin once a week.    Dispense:  1.5 mL    Refill:  2      Follow-up: Return in about 6 weeks (around 05/25/2021).  [name], M.D.

## 2021-04-14 LAB — CBC WITH DIFFERENTIAL/PLATELET
Basophils Absolute: 0.1 10*3/uL (ref 0.0–0.2)
Basos: 1 %
EOS (ABSOLUTE): 0.3 10*3/uL (ref 0.0–0.4)
Eos: 4 %
Hematocrit: 40.8 % (ref 34.0–46.6)
Hemoglobin: 14.1 g/dL (ref 11.1–15.9)
Immature Grans (Abs): 0 10*3/uL (ref 0.0–0.1)
Immature Granulocytes: 0 %
Lymphocytes Absolute: 2.2 10*3/uL (ref 0.7–3.1)
Lymphs: 33 %
MCH: 34.9 pg — ABNORMAL HIGH (ref 26.6–33.0)
MCHC: 34.6 g/dL (ref 31.5–35.7)
MCV: 101 fL — ABNORMAL HIGH (ref 79–97)
Monocytes Absolute: 0.7 10*3/uL (ref 0.1–0.9)
Monocytes: 11 %
Neutrophils Absolute: 3.4 10*3/uL (ref 1.4–7.0)
Neutrophils: 51 %
Platelets: 155 10*3/uL (ref 150–450)
RBC: 4.04 x10E6/uL (ref 3.77–5.28)
RDW: 11.7 % (ref 11.7–15.4)
WBC: 6.7 10*3/uL (ref 3.4–10.8)

## 2021-04-14 LAB — CMP14+EGFR
ALT: 40 IU/L — ABNORMAL HIGH (ref 0–32)
AST: 57 IU/L — ABNORMAL HIGH (ref 0–40)
Albumin/Globulin Ratio: 1.5 (ref 1.2–2.2)
Albumin: 4.4 g/dL (ref 3.8–4.8)
Alkaline Phosphatase: 121 IU/L (ref 44–121)
BUN/Creatinine Ratio: 17 (ref 9–23)
BUN: 12 mg/dL (ref 6–24)
Bilirubin Total: 0.4 mg/dL (ref 0.0–1.2)
CO2: 23 mmol/L (ref 20–29)
Calcium: 9.5 mg/dL (ref 8.7–10.2)
Chloride: 103 mmol/L (ref 96–106)
Creatinine, Ser: 0.72 mg/dL (ref 0.57–1.00)
Globulin, Total: 2.9 g/dL (ref 1.5–4.5)
Glucose: 125 mg/dL — ABNORMAL HIGH (ref 65–99)
Potassium: 4.4 mmol/L (ref 3.5–5.2)
Sodium: 141 mmol/L (ref 134–144)
Total Protein: 7.3 g/dL (ref 6.0–8.5)
eGFR: 104 mL/min/{1.73_m2} (ref >59)

## 2021-04-14 LAB — LIPID PANEL
Chol/HDL Ratio: 3.1 ratio (ref 0.0–4.4)
Cholesterol, Total: 151 mg/dL (ref 100–199)
HDL: 49 mg/dL (ref >39)
LDL Chol Calc (NIH): 69 mg/dL (ref 0–99)
Triglycerides: 197 mg/dL — ABNORMAL HIGH (ref 0–149)
VLDL Cholesterol Cal: 33 mg/dL (ref 5–40)

## 2021-04-14 NOTE — Progress Notes (Signed)
Hello Ayisha,  Your lab result is normal and/or stable.Some minor variations that are not significant are commonly marked abnormal, but do not represent any medical problem for you.  Best regards, Benjermin Korber, M.D.

## 2021-04-20 ENCOUNTER — Other Ambulatory Visit: Payer: Self-pay | Admitting: Family Medicine

## 2021-04-20 ENCOUNTER — Telehealth: Payer: Self-pay | Admitting: *Deleted

## 2021-04-20 MED ORDER — TRULICITY 0.75 MG/0.5ML ~~LOC~~ SOAJ: 0.7500 mg | SUBCUTANEOUS | 2 refills | Status: DC

## 2021-04-20 NOTE — Telephone Encounter (Signed)
PA for Ozempic denied patient must try and fail one of the options below.  BYETTA INJECT/PEN .  BYETTA INJECT/PEN .  TRULICITY SD PEN 0.5ML 4'S 0. 0.75MG   TRULICITY SD PEN 0.5ML 4'S 1. 1.5MG   BYDUREON BCISE AUTOINJECTR 0. 2MG   TRULICITY SD PEN 0.5ML 4'S 3MG 

## 2021-04-20 NOTE — Telephone Encounter (Signed)
Approved today APOLID:[id number];OOILNZ:[id number];Review Type:Prior Auth;Coverage Start Date:04/20/2021;Coverage End Date:04/20/2022;  Pharmacy aware

## 2021-04-20 NOTE — Telephone Encounter (Signed)
PA in process Trulicity 0.75MG /0.5ML pen-injectors   Key: [id number] - PA Case ID: [id number] - Rx #: [id number] Trulicity

## 2021-04-20 NOTE — Telephone Encounter (Signed)
Lmtcb.

## 2021-04-20 NOTE — Telephone Encounter (Signed)
I sent in trulicity to replace the ozempic.

## 2021-05-26 ENCOUNTER — Other Ambulatory Visit: Payer: Self-pay

## 2021-05-26 ENCOUNTER — Ambulatory Visit (INDEPENDENT_AMBULATORY_CARE_PROVIDER_SITE_OTHER): Payer: Managed Care, Other (non HMO) | Admitting: Family Medicine

## 2021-05-26 ENCOUNTER — Encounter: Payer: Self-pay | Admitting: Family Medicine

## 2021-05-26 VITALS — BP 136/82 | HR 81 | Ht 67.0 in | Wt 208.8 lb

## 2021-05-26 DIAGNOSIS — E119 Type 2 diabetes mellitus without complications: Secondary | ICD-10-CM | POA: Diagnosis not present

## 2021-05-26 DIAGNOSIS — F1020 Alcohol dependence, uncomplicated: Secondary | ICD-10-CM | POA: Diagnosis not present

## 2021-05-26 DIAGNOSIS — K591 Functional diarrhea: Secondary | ICD-10-CM | POA: Diagnosis not present

## 2021-05-26 MED ORDER — TRULICITY 1.5 MG/0.5ML ~~LOC~~ SOAJ: SUBCUTANEOUS | 12 refills | Status: DC

## 2021-05-26 NOTE — Progress Notes (Signed)
Subjective:  Patient ID: Casey Norman, female    DOB: [birth year]  Age: 45 y.o. MRN: [id number]  CC: Medical Management of Chronic Issues   HPI Casey Norman presents for recheck of Trulicity for DM. Still drinking 4 beers a day - was six.  Her goal is to be completely away from beer and other alcohol by the time she follows things she tells me that she was previously measuring not have an appetite for the first few days and after that it seems like her appetite has been more than usual.  Blood sugars are doing okay.  No log brought back today. Depression screen [facility] 2/9 05/26/2021 04/13/2021 04/13/2021  Decreased Interest 0 0 0  Down, Depressed, Hopeless 0 0 0  PHQ - 2 Score 0 0 0  Altered sleeping - 0 -  Tired, decreased energy - 1 -  Change in appetite - - -  Feeling bad or failure about yourself  - 0 -  Trouble concentrating - 0 -  Moving slowly or fidgety/restless - 0 -  Suicidal thoughts - 0 -  PHQ-9 Score - 1 -  Difficult doing work/chores - Somewhat difficult -    History Casey Norman has a past medical history of Diabetes mellitus without complication ([place]).   She has no past surgical history on file.   Her family history includes Cancer in her father and paternal grandfather; Cancer (age of onset: 54) in her mother; Osteopenia in her mother; Stroke in her mother.She reports that she has been smoking cigarettes. She has been smoking an average of 1 pack per day. She has never used smokeless tobacco. She reports current alcohol use. She reports that she does not use drugs.    ROS Review of Systems  Constitutional: Negative.   HENT: Negative.    Eyes:  Negative for visual disturbance.  Respiratory:  Negative for shortness of breath.   Cardiovascular:  Negative for chest pain.  Gastrointestinal:  Negative for abdominal pain.  Musculoskeletal:  Negative for arthralgias.   Objective:  BP 136/82   Pulse 81   Ht _0  (1.702 m)   Wt 208 lb 12.8 oz (94.7 kg)   SpO2 98%   BMI  32.70 kg/m   BP Readings from Last 3 Encounters:  05/26/21 136/82  04/13/21 (!) 150/86  12/29/20 (!) 167/83    Wt Readings from Last 3 Encounters:  05/26/21 208 lb 12.8 oz (94.7 kg)  04/13/21 211 lb 6.4 oz (95.9 kg)  12/29/20 213 lb 12.8 oz (97 kg)     Physical Exam Constitutional:      General: She is not in acute distress.    Appearance: She is well-developed.  Cardiovascular:     Rate and Rhythm: Normal rate and regular rhythm.  Pulmonary:     Breath sounds: Normal breath sounds.  Musculoskeletal:        General: Normal range of motion.  Skin:    General: Skin is warm and dry.  Neurological:     Mental Status: She is alert and oriented to person, place, and time.      Assessment & Plan:   Eleora was seen today for medical management of chronic issues.  Diagnoses and all orders for this visit:  Controlled type 2 diabetes mellitus without complication, without long-term current use of insulin (HCC) -     Dulaglutide (TRULICITY) 1.5 FF/6.3WG SOPN; Inject content of one pen under the skin weekly  Alcohol dependence, uncomplicated (HCC)  Functional diarrhea  I have discontinued Lawernce Ion Trulicity. I am also having her start on Trulicity. Additionally, I am having her maintain her Blood Glucose Monitor System, glucose blood, Lancets Thin, rosuvastatin, metFORMIN, metoprolol, and chlorthalidone.  Allergies as of 05/26/2021   No Known Allergies      Medication List        Accurate as of May 26, 2021  5:09 PM. If you have any questions, ask your nurse or doctor.          STOP taking these medications    Trulicity 6.55 VZ/4.8OL Sopn Generic drug: Dulaglutide Replaced by: Trulicity 1.5 MB/8.6LJ Sopn Stopped by: [name], MD       TAKE these medications    Blood Glucose Monitor System w/Device Kit 1 Device by Does not apply route 2 (two) times daily.   chlorthalidone 25 MG tablet Commonly known as: HYGROTON Take 1 tablet (25  mg total) by mouth daily.   glucose blood test strip Use as instructed   Lancets Thin Misc Use to check blood sugar twice daily   metFORMIN 500 MG 24 hr tablet Commonly known as: GLUCOPHAGE-XR Take 1 tablet (500 mg total) by mouth 2 (two) times daily.   metoprolol 200 MG 24 hr tablet Commonly known as: TOPROL-XL Take 1 tablet (200 mg total) by mouth at bedtime. For blood pressure control   rosuvastatin 10 MG tablet Commonly known as: Crestor Take 1 tablet (10 mg total) by mouth daily. For cholesterol   Trulicity 1.5 QG/9.2EF Sopn Generic drug: Dulaglutide Inject content of one pen under the skin weekly Replaces: Trulicity 0.07 HQ/1.9XJ Sopn Started by: [name], MD         Follow-up: Return in about 6 weeks (around 07/07/2021).  [name], M.D.

## 2021-05-27 ENCOUNTER — Telehealth: Payer: Self-pay | Admitting: *Deleted

## 2021-05-27 NOTE — Telephone Encounter (Signed)
Key: Nelida Gores - PA Case ID: [id number] - Rx #: [id number] Drug Trulicity 1.5MG /0.5ML pen-injectors Form Passenger transport manager PA Form [phone] NCPDP) Original Claim Info 96 Sent to plan

## 2021-06-02 NOTE — Telephone Encounter (Signed)
Approved today RAXENM:[id number];JSRPRX:[id number];Review Type:Prior Auth;Coverage Start Date:05/27/2021;Coverage End Date:05/27/2022;;CaseId:[id number];Status:Approved;Review Type:Qty;Coverage Start Date:05/27/2021;Coverage End Date:06/02/2022;  WM [place] aware

## 2021-07-07 ENCOUNTER — Encounter: Payer: Self-pay | Admitting: Family Medicine

## 2021-07-07 ENCOUNTER — Ambulatory Visit (INDEPENDENT_AMBULATORY_CARE_PROVIDER_SITE_OTHER): Payer: Managed Care, Other (non HMO) | Admitting: Family Medicine

## 2021-07-07 ENCOUNTER — Other Ambulatory Visit: Payer: Self-pay

## 2021-07-07 VITALS — BP 149/86 | HR 89 | Temp 98.3°F | Ht 67.0 in | Wt 207.4 lb

## 2021-07-07 DIAGNOSIS — E119 Type 2 diabetes mellitus without complications: Secondary | ICD-10-CM | POA: Diagnosis not present

## 2021-07-07 DIAGNOSIS — I152 Hypertension secondary to endocrine disorders: Secondary | ICD-10-CM

## 2021-07-07 DIAGNOSIS — R7989 Other specified abnormal findings of blood chemistry: Secondary | ICD-10-CM | POA: Diagnosis not present

## 2021-07-07 DIAGNOSIS — E782 Mixed hyperlipidemia: Secondary | ICD-10-CM

## 2021-07-07 DIAGNOSIS — E1159 Type 2 diabetes mellitus with other circulatory complications: Secondary | ICD-10-CM

## 2021-07-07 MED ORDER — TRULICITY 3 MG/0.5ML ~~LOC~~ SOAJ: 3.0000 mg | SUBCUTANEOUS | 2 refills | Status: DC

## 2021-07-07 NOTE — Progress Notes (Signed)
Subjective:  Patient ID: Casey Norman, female    DOB: Mar 29, 1976  Age: 45 y.o. MRN: [id number]  CC: Medical Management of Chronic Issues   HPI Casey Norman presents for recheck of weight loss. Feels she is eating less, but not losing weight. Mentioned fruit at breakfast sub at lunch.  Depression screen [facility] 2/9 07/07/2021 05/26/2021 04/13/2021  Decreased Interest 0 0 0  Down, Depressed, Hopeless 0 0 0  PHQ - 2 Score 0 0 0  Altered sleeping - - 0  Tired, decreased energy - - 1  Change in appetite - - -  Feeling bad or failure about yourself  - - 0  Trouble concentrating - - 0  Moving slowly or fidgety/restless - - 0  Suicidal thoughts - - 0  PHQ-9 Score - - 1  Difficult doing work/chores - - Somewhat difficult    History Casey Norman has a past medical history of Diabetes mellitus without complication ([place]).   She has no past surgical history on file.   Her family history includes Cancer in her father and paternal grandfather; Cancer (age of onset: 62) in her mother; Osteopenia in her mother; Stroke in her mother.She reports that she has been smoking cigarettes. She has been smoking an average of 1 pack per day. She has never used smokeless tobacco. She reports current alcohol use. She reports that she does not use drugs.    ROS Review of Systems  Constitutional:  Negative for fever.  HENT:  Negative for congestion, rhinorrhea and sore throat.   Respiratory:  Negative for cough and shortness of breath.   Cardiovascular:  Negative for chest pain and palpitations.  Gastrointestinal:  Negative for abdominal pain.  Musculoskeletal:  Negative for arthralgias and myalgias.   Objective:  BP (!) 149/86   Pulse 89   Temp 98.3 F (36.8 C)   Ht _0  (1.702 m)   Wt 207 lb 6.4 oz (94.1 kg)   SpO2 98%   BMI 32.48 kg/m   BP Readings from Last 3 Encounters:  07/07/21 (!) 149/86  05/26/21 136/82  04/13/21 (!) 150/86    Wt Readings from Last 3 Encounters:  07/07/21 207 lb 6.4 oz  (94.1 kg)  05/26/21 208 lb 12.8 oz (94.7 kg)  04/13/21 211 lb 6.4 oz (95.9 kg)     Physical Exam Constitutional:      General: She is not in acute distress.    Appearance: She is well-developed.  Cardiovascular:     Rate and Rhythm: Normal rate and regular rhythm.  Pulmonary:     Breath sounds: Normal breath sounds.  Musculoskeletal:        General: Normal range of motion.  Skin:    General: Skin is warm and dry.  Neurological:     Mental Status: She is alert and oriented to person, place, and time.      Assessment & Plan:   Shernita was seen today for medical management of chronic issues.  Diagnoses and all orders for this visit:  Controlled type 2 diabetes mellitus without complication, without long-term current use of insulin ([place]) -     Bayer DCA Hb A1c Waived; Future  Mixed hyperlipidemia -     Lipid panel; Future  Abnormal CBC -     CBC with Differential/Platelet; Future  Hypertension associated with diabetes ([place]) -     CMP14+EGFR; Future  Other orders -     Dulaglutide (TRULICITY) 3 XI/5.0TU SOPN; Inject 3 mg as directed once a week.  I have discontinued Lawernce Ion Trulicity. I am also having her start on Trulicity. Additionally, I am having her maintain her Blood Glucose Monitor System, glucose blood, Lancets Thin, rosuvastatin, metFORMIN, metoprolol, and chlorthalidone.  Allergies as of 07/07/2021   No Known Allergies      Medication List        Accurate as of July 07, 2021  6:09 PM. If you have any questions, ask your nurse or doctor.          STOP taking these medications    Trulicity 1.5 CB/7.6EG Sopn Generic drug: Dulaglutide Replaced by: Trulicity 3 BT/5.1VO Sopn Stopped by: [name], MD       TAKE these medications    Blood Glucose Monitor System w/Device Kit 1 Device by Does not apply route 2 (two) times daily.   chlorthalidone 25 MG tablet Commonly known as: HYGROTON Take 1 tablet (25 mg total) by  mouth daily.   glucose blood test strip Use as instructed   Lancets Thin Misc Use to check blood sugar twice daily   metFORMIN 500 MG 24 hr tablet Commonly known as: GLUCOPHAGE-XR Take 1 tablet (500 mg total) by mouth 2 (two) times daily.   metoprolol 200 MG 24 hr tablet Commonly known as: TOPROL-XL Take 1 tablet (200 mg total) by mouth at bedtime. For blood pressure control   rosuvastatin 10 MG tablet Commonly known as: Crestor Take 1 tablet (10 mg total) by mouth daily. For cholesterol   Trulicity 3 HY/0.7PX Sopn Generic drug: Dulaglutide Inject 3 mg as directed once a week. Replaces: Trulicity 1.5 TG/6.2IR Sopn Started by: [name], MD         Follow-up: Return in about 1 month (around 08/06/2021).  [name], M.D.

## 2021-07-07 NOTE — Patient Instructions (Signed)

## 2021-07-08 ENCOUNTER — Telehealth: Payer: Self-pay | Admitting: *Deleted

## 2021-07-08 DIAGNOSIS — E119 Type 2 diabetes mellitus without complications: Secondary | ICD-10-CM

## 2021-07-08 NOTE — Telephone Encounter (Signed)
Approved today CaseId:[id number];Status:Approved;Review Type:Prior Auth;Coverage Start Date:07/08/2021;Coverage End Date:07/08/2022 WM aware

## 2021-07-08 NOTE — Telephone Encounter (Signed)
Trulicity 3MG /0.5ML pen-injectors Key: [id number] Sent to Plan today

## 2021-08-12 ENCOUNTER — Other Ambulatory Visit: Payer: Self-pay

## 2021-08-12 ENCOUNTER — Ambulatory Visit (INDEPENDENT_AMBULATORY_CARE_PROVIDER_SITE_OTHER): Payer: Managed Care, Other (non HMO) | Admitting: Family Medicine

## 2021-08-12 ENCOUNTER — Encounter: Payer: Self-pay | Admitting: Family Medicine

## 2021-08-12 DIAGNOSIS — E1159 Type 2 diabetes mellitus with other circulatory complications: Secondary | ICD-10-CM

## 2021-08-12 DIAGNOSIS — R7989 Other specified abnormal findings of blood chemistry: Secondary | ICD-10-CM

## 2021-08-12 DIAGNOSIS — E119 Type 2 diabetes mellitus without complications: Secondary | ICD-10-CM

## 2021-08-12 DIAGNOSIS — I152 Hypertension secondary to endocrine disorders: Secondary | ICD-10-CM

## 2021-08-12 DIAGNOSIS — E782 Mixed hyperlipidemia: Secondary | ICD-10-CM

## 2021-08-12 LAB — BAYER DCA HB A1C WAIVED: HB A1C (BAYER DCA - WAIVED): 6.1 % — ABNORMAL HIGH (ref 4.8–5.6)

## 2021-08-12 MED ORDER — METOPROLOL SUCCINATE ER 200 MG PO TB24: 200.0000 mg | ORAL_TABLET | Freq: Every day | ORAL | 3 refills | Status: AC

## 2021-08-12 MED ORDER — METFORMIN HCL ER 500 MG PO TB24: 500.0000 mg | ORAL_TABLET | Freq: Two times a day (BID) | ORAL | 3 refills | Status: DC

## 2021-08-12 MED ORDER — ROSUVASTATIN CALCIUM 10 MG PO TABS: 10.0000 mg | ORAL_TABLET | Freq: Every day | ORAL | 3 refills | Status: AC

## 2021-08-12 NOTE — Progress Notes (Signed)
Subjective:  Patient ID: Casey Norman, female    DOB: 02-Nov-1975  Age: 45 y.o. MRN: [id number]  CC: Follow-up   HPI Casey Norman presents for doing resistance training 6 days a week . Taking Trulicity presents forFollow-up of diabetes. Patient does not check blood sugar at home.    Patient denies symptoms such as polyuria, polydipsia, excessive hunger, nausea No significant hypoglycemic spells noted. Medications reviewed. Pt reports taking them regularly without complication/adverse reaction being reported today.    presents for  follow-up of hypertension. Patient has no history of headache chest pain or shortness of breath or recent cough. Patient also denies symptoms of TIA such as focal numbness or weakness. Patient denies side effects from medication. States taking it regularly.  in for follow-up of elevated cholesterol. Doing well without complaints on current medication. Denies side effects of statin including myalgia and arthralgia and nausea. Currently no chest pain, shortness of breath or other cardiovascular related symptoms noted.     Depression screen [facility] 2/9 08/12/2021 07/07/2021 05/26/2021  Decreased Interest 0 0 0  Down, Depressed, Hopeless 0 0 0  PHQ - 2 Score 0 0 0  Altered sleeping - - -  Tired, decreased energy - - -  Change in appetite - - -  Feeling bad or failure about yourself  - - -  Trouble concentrating - - -  Moving slowly or fidgety/restless - - -  Suicidal thoughts - - -  PHQ-9 Score - - -  Difficult doing work/chores - - -    History Casey Norman has a past medical history of Diabetes mellitus without complication ([place]).   She has no past surgical history on file.   Her family history includes Cancer in her father and paternal grandfather; Cancer (age of onset: 33) in her mother; Osteopenia in her mother; Stroke in her mother.She reports that she has been smoking cigarettes. She has been smoking an average of 1 pack per day. She has never used  smokeless tobacco. She reports current alcohol use. She reports that she does not use drugs.    ROS Review of Systems  Constitutional: Negative.   HENT: Negative.    Eyes:  Negative for visual disturbance.  Respiratory:  Negative for shortness of breath.   Cardiovascular:  Negative for chest pain.  Gastrointestinal:  Negative for abdominal pain.  Musculoskeletal:  Negative for arthralgias.  All other systems reviewed and are negative.  Objective:  BP 140/80   Pulse 81   Temp 98.2 F (36.8 C)   Ht _0  (1.702 m)   Wt 205 lb 6.4 oz (93.2 kg)   SpO2 97%   BMI 32.17 kg/m   BP Readings from Last 3 Encounters:  08/12/21 140/80  07/07/21 (!) 149/86  05/26/21 136/82    Wt Readings from Last 3 Encounters:  08/12/21 205 lb 6.4 oz (93.2 kg)  07/07/21 207 lb 6.4 oz (94.1 kg)  05/26/21 208 lb 12.8 oz (94.7 kg)     Physical Exam Constitutional:      General: She is not in acute distress.    Appearance: She is well-developed.  Cardiovascular:     Rate and Rhythm: Normal rate and regular rhythm.  Pulmonary:     Breath sounds: Normal breath sounds.  Musculoskeletal:        General: Normal range of motion.  Skin:    General: Skin is warm and dry.  Neurological:     Mental Status: She is alert and oriented to person, place, and  time.      Assessment & Plan:   [name] was seen today for follow-up.  Diagnoses and all orders for this visit:  Hypertension associated with diabetes ([place])  Mixed hyperlipidemia       I am having Derinda Late maintain her Blood Glucose Monitor System, glucose blood, Lancets Thin, rosuvastatin, metFORMIN, metoprolol, chlorthalidone, and Trulicity.  Allergies as of 08/12/2021   No Known Allergies      Medication List        Accurate as of August 12, 2021  4:45 PM. If you have any questions, ask your nurse or doctor.          Blood Glucose Monitor System w/Device Kit 1 Device by Does not apply route 2 (two) times daily.    chlorthalidone 25 MG tablet Commonly known as: HYGROTON Take 1 tablet (25 mg total) by mouth daily.   glucose blood test strip Use as instructed   Lancets Thin Misc Use to check blood sugar twice daily   metFORMIN 500 MG 24 hr tablet Commonly known as: GLUCOPHAGE-XR Take 1 tablet (500 mg total) by mouth 2 (two) times daily.   metoprolol 200 MG 24 hr tablet Commonly known as: TOPROL-XL Take 1 tablet (200 mg total) by mouth at bedtime. For blood pressure control   rosuvastatin 10 MG tablet Commonly known as: Crestor Take 1 tablet (10 mg total) by mouth daily. For cholesterol   Trulicity 3 TN/5.3XY Sopn Generic drug: Dulaglutide Inject 3 mg as directed once a week.         Follow-up: No follow-ups on file.  [name], M.D.

## 2021-08-13 LAB — CBC WITH DIFFERENTIAL/PLATELET
Basophils Absolute: 0.1 10*3/uL (ref 0.0–0.2)
Basos: 1 %
EOS (ABSOLUTE): 0.2 10*3/uL (ref 0.0–0.4)
Eos: 4 %
Hematocrit: 38.3 % (ref 34.0–46.6)
Hemoglobin: 13.1 g/dL (ref 11.1–15.9)
Immature Grans (Abs): 0 10*3/uL (ref 0.0–0.1)
Immature Granulocytes: 0 %
Lymphocytes Absolute: 1.7 10*3/uL (ref 0.7–3.1)
Lymphs: 32 %
MCH: 32.8 pg (ref 26.6–33.0)
MCHC: 34.2 g/dL (ref 31.5–35.7)
MCV: 96 fL (ref 79–97)
Monocytes Absolute: 0.6 10*3/uL (ref 0.1–0.9)
Monocytes: 10 %
Neutrophils Absolute: 2.9 10*3/uL (ref 1.4–7.0)
Neutrophils: 53 %
Platelets: 189 10*3/uL (ref 150–450)
RBC: 3.99 x10E6/uL (ref 3.77–5.28)
RDW: 11.3 % — ABNORMAL LOW (ref 11.7–15.4)
WBC: 5.4 10*3/uL (ref 3.4–10.8)

## 2021-08-13 LAB — CMP14+EGFR
ALT: 49 IU/L — ABNORMAL HIGH (ref 0–32)
AST: 57 IU/L — ABNORMAL HIGH (ref 0–40)
Albumin/Globulin Ratio: 1.1 — ABNORMAL LOW (ref 1.2–2.2)
Albumin: 4 g/dL (ref 3.8–4.8)
Alkaline Phosphatase: 103 IU/L (ref 44–121)
BUN/Creatinine Ratio: 12 (ref 9–23)
BUN: 9 mg/dL (ref 6–24)
Bilirubin Total: 0.4 mg/dL (ref 0.0–1.2)
CO2: 31 mmol/L — ABNORMAL HIGH (ref 20–29)
Calcium: 9.3 mg/dL (ref 8.7–10.2)
Chloride: 93 mmol/L — ABNORMAL LOW (ref 96–106)
Creatinine, Ser: 0.74 mg/dL (ref 0.57–1.00)
Globulin, Total: 3.5 g/dL (ref 1.5–4.5)
Glucose: 179 mg/dL — ABNORMAL HIGH (ref 70–99)
Potassium: 3.6 mmol/L (ref 3.5–5.2)
Sodium: 136 mmol/L (ref 134–144)
Total Protein: 7.5 g/dL (ref 6.0–8.5)
eGFR: 101 mL/min/{1.73_m2} (ref >59)

## 2021-08-13 LAB — LIPID PANEL
Chol/HDL Ratio: 3.1 ratio (ref 0.0–4.4)
Cholesterol, Total: 124 mg/dL (ref 100–199)
HDL: 40 mg/dL (ref >39)
LDL Chol Calc (NIH): 54 mg/dL (ref 0–99)
Triglycerides: 179 mg/dL — ABNORMAL HIGH (ref 0–149)
VLDL Cholesterol Cal: 30 mg/dL (ref 5–40)

## 2021-08-13 LAB — FOLATE: Folate: 8.5 ng/mL (ref >3.0)

## 2021-08-13 LAB — VITAMIN B12: Vitamin B-12: 440 pg/mL (ref 232–1245)

## 2021-08-13 NOTE — Progress Notes (Signed)
Hello Casey Norman,  Your lab result is normal and/or stable.Some minor variations that are not significant are commonly marked abnormal, but do not represent any medical problem for you.  Best regards, Takyra Cantrall, M.D.

## 2021-10-10 ENCOUNTER — Other Ambulatory Visit: Payer: Self-pay | Admitting: Family Medicine

## 2021-11-13 ENCOUNTER — Other Ambulatory Visit: Payer: Self-pay | Admitting: Family Medicine

## 2021-11-19 ENCOUNTER — Ambulatory Visit (INDEPENDENT_AMBULATORY_CARE_PROVIDER_SITE_OTHER): Payer: Managed Care, Other (non HMO) | Admitting: Family Medicine

## 2021-11-19 ENCOUNTER — Encounter: Payer: Self-pay | Admitting: Family Medicine

## 2021-11-19 VITALS — BP 122/77 | HR 84 | Temp 98.4°F | Ht 67.0 in | Wt 190.0 lb

## 2021-11-19 DIAGNOSIS — E119 Type 2 diabetes mellitus without complications: Secondary | ICD-10-CM | POA: Diagnosis not present

## 2021-11-19 DIAGNOSIS — E1169 Type 2 diabetes mellitus with other specified complication: Secondary | ICD-10-CM | POA: Diagnosis not present

## 2021-11-19 DIAGNOSIS — E782 Mixed hyperlipidemia: Secondary | ICD-10-CM

## 2021-11-19 DIAGNOSIS — E1159 Type 2 diabetes mellitus with other circulatory complications: Secondary | ICD-10-CM

## 2021-11-19 DIAGNOSIS — G2581 Restless legs syndrome: Secondary | ICD-10-CM

## 2021-11-19 DIAGNOSIS — I152 Hypertension secondary to endocrine disorders: Secondary | ICD-10-CM

## 2021-11-19 DIAGNOSIS — E756 Lipid storage disorder, unspecified: Secondary | ICD-10-CM

## 2021-11-19 LAB — BAYER DCA HB A1C WAIVED: HB A1C (BAYER DCA - WAIVED): 6.3 % — ABNORMAL HIGH (ref 4.8–5.6)

## 2021-11-19 MED ORDER — TRULICITY 3 MG/0.5ML ~~LOC~~ SOAJ: 3.0000 mg | SUBCUTANEOUS | 3 refills | Status: DC

## 2021-11-19 MED ORDER — ROPINIROLE HCL 1 MG PO TABS: 1.0000 mg | ORAL_TABLET | Freq: Every day | ORAL | 5 refills | Status: DC

## 2021-11-19 MED ORDER — CHLORTHALIDONE 25 MG PO TABS: 25.0000 mg | ORAL_TABLET | Freq: Every day | ORAL | 3 refills | Status: DC

## 2021-11-19 NOTE — Progress Notes (Signed)
Subjective:  Patient ID: Casey Norman,  female    DOB: 01-20-1976  Age: 46 y.o.    CC: Medical Management of Chronic Issues   HPI Casey Norman presents for  follow-up of hypertension. Patient has no history of headache chest pain or shortness of breath or recent cough. Patient also denies symptoms of TIA such as numbness weakness lateralizing. Patient denies side effects from medication. States taking it regularly.  ONset 1.5 weeks ago of a muscle spasm that shoots down the legs from hip to above the knee. Sometiimes makes the leg jump.   Patient also  in for follow-up of elevated cholesterol. Doing well without complaints on current medication. Denies side effects  including myalgia and arthralgia and nausea. Also in today for liver function testing. Currently no chest pain, shortness of breath or other cardiovascular related symptoms noted.  Follow-up of diabetes. Patient does not check blood sugar at home. Patient denies symptoms such as excessive hunger or urinary frequency, excessive hunger, nausea No significant hypoglycemic spellot s noted. Medications reviewed. Pt reports taking them regularly. Pt. denies complication/adverse reaction today.   presents forFollow-up of diabetes. Patient denies symptoms such as polyuria, polydipsia, excessive hunger, nausea No significant hypoglycemic spells noted. Medications reviewed. Pt reports taking them regularly without complication/adverse reaction being reported today.      History Casey Norman has a past medical history of Diabetes mellitus without complication ([place]).   She has no past surgical history on file.   Her family history includes Cancer in her father and paternal grandfather; Cancer (age of onset: 28) in her mother; Osteopenia in her mother; Stroke in her mother.She reports that she has been smoking cigarettes. She has been smoking an average of 1 pack per day. She has never used smokeless tobacco. She reports current alcohol  use. She reports that she does not use drugs.  Current Outpatient Medications on File Prior to Visit  Medication Sig Dispense Refill   Blood Glucose Monitoring Suppl (BLOOD GLUCOSE MONITOR SYSTEM) w/Device KIT 1 Device by Does not apply route 2 (two) times daily. 1 each 0   glucose blood test strip Use as instructed 100 each 12   Lancets Thin MISC Use to check blood sugar twice daily 100 each 12   metFORMIN (GLUCOPHAGE-XR) 500 MG 24 hr tablet Take 1 tablet (500 mg total) by mouth 2 (two) times daily. 180 tablet 3   metoprolol (TOPROL-XL) 200 MG 24 hr tablet Take 1 tablet (200 mg total) by mouth at bedtime. For blood pressure control 90 tablet 3   rosuvastatin (CRESTOR) 10 MG tablet Take 1 tablet (10 mg total) by mouth daily. For cholesterol 90 tablet 3   No current facility-administered medications on file prior to visit.    ROS Review of Systems  Constitutional: Negative.   HENT: Negative.    Eyes:  Negative for visual disturbance.  Respiratory:  Negative for shortness of breath.   Cardiovascular:  Negative for chest pain.  Gastrointestinal:  Negative for abdominal pain.  Musculoskeletal:  Positive for myalgias. Negative for arthralgias.   Objective:  BP 122/77    Pulse 84    Temp 98.4 F (36.9 C)    Ht 5' 7"  (1.702 m)    Wt 190 lb (86.2 kg)    SpO2 99%    BMI 29.76 kg/m   BP Readings from Last 3 Encounters:  11/19/21 122/77  08/12/21 140/80  07/07/21 (!) 149/86    Wt Readings from Last 3 Encounters:  11/19/21 190 lb (  86.2 kg)  08/12/21 205 lb 6.4 oz (93.2 kg)  07/07/21 207 lb 6.4 oz (94.1 kg)     Physical Exam Constitutional:      General: She is not in acute distress.    Appearance: She is well-developed.  Cardiovascular:     Rate and Rhythm: Normal rate and regular rhythm.  Pulmonary:     Breath sounds: Normal breath sounds.  Musculoskeletal:        General: Normal range of motion.  Skin:    General: Skin is warm and dry.  Neurological:     Mental Status:  She is alert and oriented to person, place, and time.    Assessment & Plan:   Roderica was seen today for medical management of chronic issues.  Diagnoses and all orders for this visit:  Hypertension associated with diabetes ([place]) -     CBC with Differential/Platelet -     CMP14+EGFR  Mixed hyperlipidemia -     Lipid panel  Controlled type 2 diabetes mellitus without complication, without long-term current use of insulin ([facility]) -     Bayer DCA Hb A1c Waived  Diabetic lipidosis ([facility])  Restless leg syndrome  Other orders -     chlorthalidone (HYGROTON) 25 MG tablet; Take 1 tablet (25 mg total) by mouth daily. -     rOPINIRole (REQUIP) 1 MG tablet; Take 1 tablet (1 mg total) by mouth at bedtime. For leg cramps -     Dulaglutide (TRULICITY) 3 YT/1.1NB SOPN; Inject 3 mg into the skin once a week.   I have changed Casey Norman Trulicity. I am also having her start on rOPINIRole. Additionally, I am having her maintain her Blood Glucose Monitor System, glucose blood, Lancets Thin, metFORMIN, metoprolol, rosuvastatin, and chlorthalidone.  Meds ordered this encounter  Medications   chlorthalidone (HYGROTON) 25 MG tablet    Sig: Take 1 tablet (25 mg total) by mouth daily.    Dispense:  90 tablet    Refill:  3   rOPINIRole (REQUIP) 1 MG tablet    Sig: Take 1 tablet (1 mg total) by mouth at bedtime. For leg cramps    Dispense:  30 tablet    Refill:  5   Dulaglutide (TRULICITY) 3 VA/7.0LI SOPN    Sig: Inject 3 mg into the skin once a week.    Dispense:  6.5 mL    Refill:  3     Follow-up: Return in about 3 months (around 02/17/2022).  [name], M.D.

## 2021-11-20 LAB — CBC WITH DIFFERENTIAL/PLATELET
Basophils Absolute: 0.1 10*3/uL (ref 0.0–0.2)
Basos: 2 %
EOS (ABSOLUTE): 0.2 10*3/uL (ref 0.0–0.4)
Eos: 4 %
Hematocrit: 39.3 % (ref 34.0–46.6)
Hemoglobin: 13.2 g/dL (ref 11.1–15.9)
Immature Grans (Abs): 0 10*3/uL (ref 0.0–0.1)
Immature Granulocytes: 0 %
Lymphocytes Absolute: 1.6 10*3/uL (ref 0.7–3.1)
Lymphs: 33 %
MCH: 29.6 pg (ref 26.6–33.0)
MCHC: 33.6 g/dL (ref 31.5–35.7)
MCV: 88 fL (ref 79–97)
Monocytes Absolute: 0.5 10*3/uL (ref 0.1–0.9)
Monocytes: 10 %
Neutrophils Absolute: 2.5 10*3/uL (ref 1.4–7.0)
Neutrophils: 51 %
Platelets: 218 10*3/uL (ref 150–450)
RBC: 4.46 x10E6/uL (ref 3.77–5.28)
RDW: 13.1 % (ref 11.7–15.4)
WBC: 4.8 10*3/uL (ref 3.4–10.8)

## 2021-11-20 LAB — CMP14+EGFR
ALT: 34 IU/L — ABNORMAL HIGH (ref 0–32)
AST: 50 IU/L — ABNORMAL HIGH (ref 0–40)
Albumin/Globulin Ratio: 1.4 (ref 1.2–2.2)
Albumin: 4.5 g/dL (ref 3.8–4.8)
Alkaline Phosphatase: 92 IU/L (ref 44–121)
BUN/Creatinine Ratio: 13 (ref 9–23)
BUN: 8 mg/dL (ref 6–24)
Bilirubin Total: 0.3 mg/dL (ref 0.0–1.2)
CO2: 27 mmol/L (ref 20–29)
Calcium: 9.9 mg/dL (ref 8.7–10.2)
Chloride: 95 mmol/L — ABNORMAL LOW (ref 96–106)
Creatinine, Ser: 0.64 mg/dL (ref 0.57–1.00)
Globulin, Total: 3.2 g/dL (ref 1.5–4.5)
Glucose: 133 mg/dL — ABNORMAL HIGH (ref 70–99)
Potassium: 3.5 mmol/L (ref 3.5–5.2)
Sodium: 138 mmol/L (ref 134–144)
Total Protein: 7.7 g/dL (ref 6.0–8.5)
eGFR: 110 mL/min/{1.73_m2} (ref >59)

## 2021-11-20 LAB — LIPID PANEL
Chol/HDL Ratio: 3.5 ratio (ref 0.0–4.4)
Cholesterol, Total: 118 mg/dL (ref 100–199)
HDL: 34 mg/dL — ABNORMAL LOW (ref >39)
LDL Chol Calc (NIH): 62 mg/dL (ref 0–99)
Triglycerides: 121 mg/dL (ref 0–149)
VLDL Cholesterol Cal: 22 mg/dL (ref 5–40)

## 2021-11-21 ENCOUNTER — Encounter: Payer: Self-pay | Admitting: Family Medicine

## 2022-02-17 ENCOUNTER — Encounter: Payer: Self-pay | Admitting: Family Medicine

## 2022-02-17 ENCOUNTER — Ambulatory Visit (INDEPENDENT_AMBULATORY_CARE_PROVIDER_SITE_OTHER): Payer: Managed Care, Other (non HMO) | Admitting: Family Medicine

## 2022-02-17 DIAGNOSIS — E1159 Type 2 diabetes mellitus with other circulatory complications: Secondary | ICD-10-CM

## 2022-02-17 DIAGNOSIS — E782 Mixed hyperlipidemia: Secondary | ICD-10-CM

## 2022-02-17 DIAGNOSIS — I152 Hypertension secondary to endocrine disorders: Secondary | ICD-10-CM

## 2022-02-17 DIAGNOSIS — E119 Type 2 diabetes mellitus without complications: Secondary | ICD-10-CM

## 2022-02-17 DIAGNOSIS — G2581 Restless legs syndrome: Secondary | ICD-10-CM

## 2022-02-17 MED ORDER — ROPINIROLE HCL 1 MG PO TABS: 1.0000 mg | ORAL_TABLET | Freq: Every day | ORAL | 5 refills | Status: DC

## 2022-02-17 MED ORDER — TRULICITY 4.5 MG/0.5ML ~~LOC~~ SOAJ: 4.5000 mg | SUBCUTANEOUS | 3 refills | Status: AC

## 2022-02-17 MED ORDER — LINACLOTIDE 145 MCG PO CAPS: 145.0000 ug | ORAL_CAPSULE | Freq: Every day | ORAL | 5 refills | Status: DC

## 2022-02-17 NOTE — Progress Notes (Signed)
? ?Subjective:  ?Patient ID: Casey Norman,  ?female    DOB: [birth year]  Age: 46 y.o.  ? ? ?CC: No chief complaint on file. ? ? ?HPI ?Casey Norman presents for  follow-up of hypertension. Patient has no history of headache chest pain or shortness of breath or recent cough. Patient also denies symptoms of TIA such as numbness weakness lateralizing. Patient denies side effects from medication. States taking it regularly. ? ?Patient also  in for follow-up of elevated cholesterol. Doing well without complaints on current medication. Denies side effects  including myalgia and arthralgia and nausea. Also in today 4`for liver function testing. Currently no chest pain, shortness of breath or other cardiovascular related symptoms noted. ? ?Follow-up of diabetes. Patient does not check blood sugar at home. Patient denies symptoms such as excessive hunger or urinary frequency, excessive hunger, nausea ?No significant hypoglycemic spells noted. ?Restless legs still active. Worse when watching TV in the evening.  ? ?Medications reviewed. Pt reports taking them regularly. Pt. denies complication/adverse reaction today.  ? ? ?History ?Casey Norman has a past medical history of Diabetes mellitus without complication (Casey Norman).  ? ?She has no past surgical history on file.  ? ?Her family history includes Cancer in her father and paternal grandfather; Cancer (age of onset: 58) in her mother; Osteopenia in her mother; Stroke in her mother.She reports that she has been smoking cigarettes. She has been smoking an average of 1 pack per day. She has never used smokeless tobacco. She reports current alcohol use. She reports that she does not use drugs. ? ?Current Outpatient Medications on File Prior to Visit  ?Medication Sig Dispense Refill  ? chlorthalidone (HYGROTON) 25 MG tablet Take 1 tablet (25 mg total) by mouth daily. 90 tablet 3  ? glucose blood test strip Use as instructed 100 each 12  ? Lancets Thin MISC Use to check blood sugar twice  daily 100 each 12  ? metFORMIN (GLUCOPHAGE-XR) 500 MG 24 hr tablet Take 1 tablet (500 mg total) by mouth 2 (two) times daily. 180 tablet 3  ? metoprolol (TOPROL-XL) 200 MG 24 hr tablet Take 1 tablet (200 mg total) by mouth at bedtime. For blood pressure control 90 tablet 3  ? rosuvastatin (CRESTOR) 10 MG tablet Take 1 tablet (10 mg total) by mouth daily. For cholesterol 90 tablet 3  ? ?No current facility-administered medications on file prior to visit.  ? ? ?ROS ?Review of Systems  ?Constitutional: Negative.   ?HENT: Negative.    ?Eyes:  Negative for visual disturbance.  ?Respiratory:  Negative for shortness of breath.   ?Cardiovascular:  Negative for chest pain.  ?Gastrointestinal:  Negative for abdominal pain.  ?Musculoskeletal:  Negative for arthralgias.  ? ?Objective:  ?There were no vitals taken for this visit. ? ?BP Readings from Last 3 Encounters:  ?11/19/21 122/77  ?08/12/21 140/80  ?07/07/21 (!) 149/86  ? ? ?Wt Readings from Last 3 Encounters:  ?11/19/21 190 lb (86.2 kg)  ?08/12/21 205 lb 6.4 oz (93.2 kg)  ?07/07/21 207 lb 6.4 oz (94.1 kg)  ? ? ? ?Physical Exam ?Constitutional:   ?   General: She is not in acute distress. ?   Appearance: She is well-developed.  ?Cardiovascular:  ?   Rate and Rhythm: Normal rate and regular rhythm.  ?Pulmonary:  ?   Breath sounds: Normal breath sounds.  ?Musculoskeletal:     ?   General: Normal range of motion.  ?Skin: ?   General: Skin is warm and dry.  ?  Neurological:  ?   Mental Status: She is alert and oriented to person, place, and time.  ? ? ?Diabetic Foot Exam - Simple   ?No data filed ?  ? ? ? ? ?Assessment & Plan:  ? ?Diagnoses and all orders for this visit: ? ?Hypertension associated with diabetes ([place]) ?-     CBC with Differential/Platelet ?-     CMP14+EGFR ? ?Mixed hyperlipidemia ?-     Lipid panel ? ?Controlled type 2 diabetes mellitus without complication, without long-term current use of insulin ([place]) ?-     Bayer DCA Hb A1c Waived ? ?Restless leg  syndrome ? ?Other orders ?-     rOPINIRole (REQUIP) 1 MG tablet; Take 1 tablet (1 mg total) by mouth at bedtime. For leg cramps ?-     Dulaglutide (TRULICITY) 4.5 YQ/0.3KV SOPN; Inject 4.5 mg as directed once a week. ?-     linaclotide (LINZESS) 145 MCG CAPS capsule; Take 1 capsule (145 mcg total) by mouth daily. To regulate bowel movements ? ? ?I have discontinued Casey Norman's Blood Glucose Monitor System and Trulicity. I am also having her start on Trulicity and linaclotide. Additionally, I am having her maintain her glucose blood, Lancets Thin, metFORMIN, metoprolol, rosuvastatin, chlorthalidone, and rOPINIRole. ? ?Meds ordered this encounter  ?Medications  ? rOPINIRole (REQUIP) 1 MG tablet  ?  Sig: Take 1 tablet (1 mg total) by mouth at bedtime. For leg cramps  ?  Dispense:  30 tablet  ?  Refill:  5  ? Dulaglutide (TRULICITY) 4.5 QQ/5.9DG SOPN  ?  Sig: Inject 4.5 mg as directed once a week.  ?  Dispense:  6 mL  ?  Refill:  3  ? linaclotide (LINZESS) 145 MCG CAPS capsule  ?  Sig: Take 1 capsule (145 mcg total) by mouth daily. To regulate bowel movements  ?  Dispense:  30 capsule  ?  Refill:  5  ? ? ? ?Follow-up: Return in about 3 months (around 05/19/2022). ? ?Casey Norman, M.D. ?

## 2022-02-18 LAB — CMP14+EGFR
ALT: 22 IU/L (ref 0–32)
AST: 35 IU/L (ref 0–40)
Albumin/Globulin Ratio: 1.3 (ref 1.2–2.2)
Albumin: 4 g/dL (ref 3.8–4.8)
Alkaline Phosphatase: 86 IU/L (ref 44–121)
BUN/Creatinine Ratio: 13 (ref 9–23)
BUN: 9 mg/dL (ref 6–24)
Bilirubin Total: <0.2 mg/dL (ref 0.0–1.2)
CO2: 26 mmol/L (ref 20–29)
Calcium: 10.1 mg/dL (ref 8.7–10.2)
Chloride: 100 mmol/L (ref 96–106)
Creatinine, Ser: 0.68 mg/dL (ref 0.57–1.00)
Globulin, Total: 3.1 g/dL (ref 1.5–4.5)
Glucose: 100 mg/dL — ABNORMAL HIGH (ref 70–99)
Potassium: 3.8 mmol/L (ref 3.5–5.2)
Sodium: 139 mmol/L (ref 134–144)
Total Protein: 7.1 g/dL (ref 6.0–8.5)
eGFR: 108 mL/min/{1.73_m2} (ref >59)

## 2022-02-18 LAB — CBC WITH DIFFERENTIAL/PLATELET
Basophils Absolute: 0.1 10*3/uL (ref 0.0–0.2)
Basos: 1 %
EOS (ABSOLUTE): 0.3 10*3/uL (ref 0.0–0.4)
Eos: 5 %
Hematocrit: 35.6 % (ref 34.0–46.6)
Hemoglobin: 12 g/dL (ref 11.1–15.9)
Immature Grans (Abs): 0 10*3/uL (ref 0.0–0.1)
Immature Granulocytes: 0 %
Lymphocytes Absolute: 1.7 10*3/uL (ref 0.7–3.1)
Lymphs: 33 %
MCH: 29.2 pg (ref 26.6–33.0)
MCHC: 33.7 g/dL (ref 31.5–35.7)
MCV: 87 fL (ref 79–97)
Monocytes Absolute: 0.4 10*3/uL (ref 0.1–0.9)
Monocytes: 8 %
Neutrophils Absolute: 2.7 10*3/uL (ref 1.4–7.0)
Neutrophils: 53 %
Platelets: 206 10*3/uL (ref 150–450)
RBC: 4.11 x10E6/uL (ref 3.77–5.28)
RDW: 13.5 % (ref 11.7–15.4)
WBC: 5.1 10*3/uL (ref 3.4–10.8)

## 2022-02-18 LAB — LIPID PANEL
Chol/HDL Ratio: 4 ratio (ref 0.0–4.4)
Cholesterol, Total: 182 mg/dL (ref 100–199)
HDL: 45 mg/dL (ref >39)
LDL Chol Calc (NIH): 108 mg/dL — ABNORMAL HIGH (ref 0–99)
Triglycerides: 165 mg/dL — ABNORMAL HIGH (ref 0–149)
VLDL Cholesterol Cal: 29 mg/dL (ref 5–40)

## 2022-02-18 LAB — BAYER DCA HB A1C WAIVED: HB A1C (BAYER DCA - WAIVED): 5.8 % — ABNORMAL HIGH (ref 4.8–5.6)

## 2022-02-21 NOTE — Progress Notes (Signed)
Hello Giselle,  Your lab result is normal and/or stable.Some minor variations that are not significant are commonly marked abnormal, but do not represent any medical problem for you.  Best regards, Read Bonelli, M.D.

## 2022-04-13 ENCOUNTER — Telehealth: Payer: Self-pay | Admitting: *Deleted

## 2022-04-13 NOTE — Telephone Encounter (Signed)
Key: [id number]  Drug Trulicity 0.75MG /0.5ML pen-injectors Form Passenger transport manager PA Form [phone] NCPDP)   Drug is covered by current benefit plan. No further PA activity needed

## 2022-05-17 ENCOUNTER — Telehealth: Payer: Self-pay | Admitting: *Deleted

## 2022-05-17 NOTE — Telephone Encounter (Signed)
Key: [id number] - PA Case ID: [id number]  Drug  Trulicity 1.5MG /0.5ML pen-injectors  Form  Passenger transport manager PA Form [phone] NCPDP)   Sent to plan

## 2022-05-24 NOTE — Telephone Encounter (Signed)
N/Aon July 17 No Prior Authorization is required at this time based on the alternative drug you chose to prescribe.;CaseId:[id number];Status:Cancelled;  [facility] made aware.

## 2022-05-26 ENCOUNTER — Ambulatory Visit (INDEPENDENT_AMBULATORY_CARE_PROVIDER_SITE_OTHER): Payer: Managed Care, Other (non HMO) | Admitting: Family Medicine

## 2022-05-26 ENCOUNTER — Encounter: Payer: Self-pay | Admitting: Family Medicine

## 2022-05-26 VITALS — BP 136/81 | HR 85 | Temp 98.2°F | Ht 67.0 in | Wt 185.6 lb

## 2022-05-26 DIAGNOSIS — E782 Mixed hyperlipidemia: Secondary | ICD-10-CM | POA: Diagnosis not present

## 2022-05-26 DIAGNOSIS — E119 Type 2 diabetes mellitus without complications: Secondary | ICD-10-CM

## 2022-05-26 DIAGNOSIS — I152 Hypertension secondary to endocrine disorders: Secondary | ICD-10-CM

## 2022-05-26 DIAGNOSIS — E1159 Type 2 diabetes mellitus with other circulatory complications: Secondary | ICD-10-CM

## 2022-05-26 DIAGNOSIS — G2581 Restless legs syndrome: Secondary | ICD-10-CM

## 2022-05-26 DIAGNOSIS — E1169 Type 2 diabetes mellitus with other specified complication: Secondary | ICD-10-CM

## 2022-05-26 DIAGNOSIS — E756 Lipid storage disorder, unspecified: Secondary | ICD-10-CM

## 2022-05-26 LAB — BAYER DCA HB A1C WAIVED: HB A1C (BAYER DCA - WAIVED): 5.1 % (ref 4.8–5.6)

## 2022-05-26 MED ORDER — ROPINIROLE HCL 2 MG PO TABS: 2.0000 mg | ORAL_TABLET | Freq: Every day | ORAL | 1 refills | Status: DC

## 2022-05-26 NOTE — Progress Notes (Signed)
Subjective:  Patient ID: Casey Norman,  female    DOB: 03-28-1976  Age: 46 y.o.    CC: Medical Management of Chronic Issues   HPI Casey Norman presents for  follow-up of hypertension. Patient has no history of headache chest pain or shortness of breath or recent cough. Patient also denies symptoms of TIA such as numbness weakness lateralizing. Patient denies side effects from medication. States taking it regularly.  Still having leg cramping during day and night. Partial relief with ropinirole.   Patient also  in for follow-up of elevated cholesterol. Doing well without complaints on current medication. Denies side effects  including myalgia and arthralgia and nausea. Also in today for liver function testing. Currently no chest pain, shortness of breath or other cardiovascular related symptoms noted.  Follow-up of diabetes. Patient denies symptoms such as excessive hunger or urinary frequency, excessive hunger, nausea No significant hypoglycemic spells noted. Medications reviewed. Pt reports taking them regularly. Pt. denies complication/adverse reaction today.    History Casey Norman has a past medical history of Diabetes mellitus without complication ([place]).   She has no past surgical history on file.   Her family history includes Cancer in her father and paternal grandfather; Cancer (age of onset: 12) in her mother; Osteopenia in her mother; Stroke in her mother.She reports that she has been smoking cigarettes. She has been smoking an average of 1 pack per day. She has never used smokeless tobacco. She reports current alcohol use. She reports that she does not use drugs.  Current Outpatient Medications on File Prior to Visit  Medication Sig Dispense Refill   chlorthalidone (HYGROTON) 25 MG tablet Take 1 tablet (25 mg total) by mouth daily. 90 tablet 3   Dulaglutide (TRULICITY) 4.5 JM/4.2AS SOPN Inject 4.5 mg as directed once a week. 6 mL 3   glucose blood test strip Use as instructed  100 each 12   Lancets Thin MISC Use to check blood sugar twice daily 100 each 12   metoprolol (TOPROL-XL) 200 MG 24 hr tablet Take 1 tablet (200 mg total) by mouth at bedtime. For blood pressure control 90 tablet 3   rosuvastatin (CRESTOR) 10 MG tablet Take 1 tablet (10 mg total) by mouth daily. For cholesterol 90 tablet 3   No current facility-administered medications on file prior to visit.    ROS Review of Systems  Constitutional: Negative.   HENT: Negative.    Eyes:  Negative for visual disturbance.  Respiratory:  Negative for shortness of breath.   Cardiovascular:  Negative for chest pain.  Gastrointestinal:  Negative for abdominal pain.  Musculoskeletal:  Negative for arthralgias.    Objective:  BP 136/81   Pulse 85   Temp 98.2 F (36.8 C)   Ht 5' 7"  (1.702 m)   Wt 185 lb 9.6 oz (84.2 kg)   SpO2 97%   BMI 29.07 kg/m   BP Readings from Last 3 Encounters:  05/26/22 136/81  11/19/21 122/77  08/12/21 140/80    Wt Readings from Last 3 Encounters:  05/26/22 185 lb 9.6 oz (84.2 kg)  11/19/21 190 lb (86.2 kg)  08/12/21 205 lb 6.4 oz (93.2 kg)     Physical Exam Constitutional:      General: She is not in acute distress.    Appearance: She is well-developed.  Cardiovascular:     Rate and Rhythm: Normal rate and regular rhythm.  Pulmonary:     Breath sounds: Normal breath sounds.  Musculoskeletal:  General: Normal range of motion.  Skin:    General: Skin is warm and dry.  Neurological:     Mental Status: She is alert and oriented to person, place, and time.     Diabetic Foot Exam - Simple   No data filed     Lab Results  Component Value Date   HGBA1C 5.8 (H) 02/17/2022   HGBA1C 6.3 (H) 11/19/2021   HGBA1C 6.1 (H) 08/12/2021    Assessment & Plan:   Casey Norman was seen today for medical management of chronic issues.  Diagnoses and all orders for this visit:  Hypertension associated with diabetes ([place]) -     CBC with Differential/Platelet -      CMP14+EGFR  Mixed hyperlipidemia -     Lipid panel  Controlled type 2 diabetes mellitus without complication, without long-term current use of insulin ([facility]) -     Bayer DCA Hb A1c Waived -     Microalbumin / creatinine urine ratio  Restless leg syndrome  Diabetic lipidosis ([facility])  Other orders -     rOPINIRole (REQUIP) 2 MG tablet; Take 1 tablet (2 mg total) by mouth at bedtime. For leg cramps   I have discontinued Casey Norman's metFORMIN and linaclotide. I have also changed her rOPINIRole. Additionally, I am having her maintain her glucose blood, Lancets Thin, metoprolol, rosuvastatin, chlorthalidone, and Trulicity.  Meds ordered this encounter  Medications   rOPINIRole (REQUIP) 2 MG tablet    Sig: Take 1 tablet (2 mg total) by mouth at bedtime. For leg cramps    Dispense:  90 tablet    Refill:  1     Follow-up: Return in about 6 months (around 11/26/2022).  [name], M.D.

## 2022-05-27 LAB — CBC WITH DIFFERENTIAL/PLATELET
Basophils Absolute: 0.1 10*3/uL (ref 0.0–0.2)
Basos: 1 %
EOS (ABSOLUTE): 0.2 10*3/uL (ref 0.0–0.4)
Eos: 4 %
Hematocrit: 34.9 % (ref 34.0–46.6)
Hemoglobin: 11.4 g/dL (ref 11.1–15.9)
Immature Grans (Abs): 0.1 10*3/uL (ref 0.0–0.1)
Immature Granulocytes: 1 %
Lymphocytes Absolute: 1.7 10*3/uL (ref 0.7–3.1)
Lymphs: 32 %
MCH: 28.9 pg (ref 26.6–33.0)
MCHC: 32.7 g/dL (ref 31.5–35.7)
MCV: 89 fL (ref 79–97)
Monocytes Absolute: 0.4 10*3/uL (ref 0.1–0.9)
Monocytes: 7 %
Neutrophils Absolute: 2.8 10*3/uL (ref 1.4–7.0)
Neutrophils: 55 %
Platelets: 205 10*3/uL (ref 150–450)
RBC: 3.94 x10E6/uL (ref 3.77–5.28)
RDW: 13.2 % (ref 11.7–15.4)
WBC: 5.2 10*3/uL (ref 3.4–10.8)

## 2022-05-27 LAB — CMP14+EGFR
ALT: 22 IU/L (ref 0–32)
AST: 32 IU/L (ref 0–40)
Albumin/Globulin Ratio: 1.5 (ref 1.2–2.2)
Albumin: 4.3 g/dL (ref 3.9–4.9)
Alkaline Phosphatase: 90 IU/L (ref 44–121)
BUN/Creatinine Ratio: 9 (ref 9–23)
BUN: 7 mg/dL (ref 6–24)
Bilirubin Total: 0.3 mg/dL (ref 0.0–1.2)
CO2: 25 mmol/L (ref 20–29)
Calcium: 9.5 mg/dL (ref 8.7–10.2)
Chloride: 100 mmol/L (ref 96–106)
Creatinine, Ser: 0.78 mg/dL (ref 0.57–1.00)
Globulin, Total: 2.9 g/dL (ref 1.5–4.5)
Glucose: 133 mg/dL — ABNORMAL HIGH (ref 70–99)
Potassium: 3.7 mmol/L (ref 3.5–5.2)
Sodium: 140 mmol/L (ref 134–144)
Total Protein: 7.2 g/dL (ref 6.0–8.5)
eGFR: 94 mL/min/{1.73_m2} (ref >59)

## 2022-05-27 LAB — MICROALBUMIN / CREATININE URINE RATIO
Creatinine, Urine: 17 mg/dL
Microalb/Creat Ratio: 29 mg/g creat (ref 0–29)
Microalbumin, Urine: 4.9 ug/mL

## 2022-05-27 LAB — LIPID PANEL
Chol/HDL Ratio: 2.2 ratio (ref 0.0–4.4)
Cholesterol, Total: 130 mg/dL (ref 100–199)
HDL: 58 mg/dL (ref >39)
LDL Chol Calc (NIH): 45 mg/dL (ref 0–99)
Triglycerides: 162 mg/dL — ABNORMAL HIGH (ref 0–149)
VLDL Cholesterol Cal: 27 mg/dL (ref 5–40)

## 2022-05-27 NOTE — Progress Notes (Signed)
Hello [place],  Your lab result is normal and/or stable.Some minor variations that are not significant are commonly marked abnormal, but do not represent any medical problem for you.  Best regards, Adalberto Metzgar, M.D.

## 2022-11-29 ENCOUNTER — Ambulatory Visit: Payer: Managed Care, Other (non HMO) | Admitting: Family Medicine

## 2022-11-30 ENCOUNTER — Other Ambulatory Visit: Payer: Self-pay | Admitting: Family Medicine

## 2022-12-18 ENCOUNTER — Other Ambulatory Visit: Payer: Self-pay | Admitting: Family Medicine

## 2022-12-23 ENCOUNTER — Ambulatory Visit: Payer: Managed Care, Other (non HMO) | Admitting: Family Medicine

## 2023-01-13 ENCOUNTER — Ambulatory Visit: Payer: Managed Care, Other (non HMO) | Admitting: Family Medicine

## 2023-03-16 ENCOUNTER — Other Ambulatory Visit: Payer: Self-pay | Admitting: Family Medicine
